# Patient Record
Sex: Male | Born: 1975 | Race: White | Hispanic: No | Smoking: Current every day smoker
Health system: Southern US, Community
[De-identification: ages and names within clinical notes are randomized; demographics above are authoritative.]

## PROBLEM LIST (undated history)

## (undated) DIAGNOSIS — F909 Attention-deficit hyperactivity disorder, unspecified type: Secondary | ICD-10-CM

## (undated) DIAGNOSIS — F419 Anxiety disorder, unspecified: Secondary | ICD-10-CM

## (undated) DIAGNOSIS — M549 Dorsalgia, unspecified: Secondary | ICD-10-CM

---

## 2009-09-27 ENCOUNTER — Encounter
Admission: RE | Admit: 2009-09-27 | Discharge: 2009-10-31 | Payer: Self-pay | Admitting: Physical Medicine & Rehabilitation

## 2009-09-30 ENCOUNTER — Ambulatory Visit: Payer: Self-pay | Admitting: Physical Medicine & Rehabilitation

## 2009-10-11 ENCOUNTER — Ambulatory Visit: Payer: Self-pay | Admitting: Physical Medicine & Rehabilitation

## 2009-10-31 ENCOUNTER — Encounter
Admission: RE | Admit: 2009-10-31 | Discharge: 2009-11-21 | Payer: Self-pay | Admitting: Physical Medicine & Rehabilitation

## 2009-11-01 ENCOUNTER — Ambulatory Visit: Payer: Self-pay | Admitting: Physical Medicine & Rehabilitation

## 2009-12-04 ENCOUNTER — Encounter
Admission: RE | Admit: 2009-12-04 | Discharge: 2010-03-04 | Payer: Self-pay | Admitting: Physical Medicine & Rehabilitation

## 2009-12-05 ENCOUNTER — Ambulatory Visit: Payer: Self-pay | Admitting: Physical Medicine & Rehabilitation

## 2010-01-03 ENCOUNTER — Ambulatory Visit: Payer: Self-pay | Admitting: Physical Medicine & Rehabilitation

## 2010-01-22 ENCOUNTER — Ambulatory Visit: Payer: Self-pay | Admitting: Physical Medicine & Rehabilitation

## 2010-03-11 ENCOUNTER — Encounter
Admission: RE | Admit: 2010-03-11 | Discharge: 2010-06-09 | Payer: Self-pay | Admitting: Physical Medicine & Rehabilitation

## 2010-03-28 ENCOUNTER — Ambulatory Visit: Payer: Self-pay | Admitting: Physical Medicine & Rehabilitation

## 2010-04-23 ENCOUNTER — Ambulatory Visit: Payer: Self-pay | Admitting: Physical Medicine & Rehabilitation

## 2010-05-28 ENCOUNTER — Ambulatory Visit: Payer: Self-pay | Admitting: Physical Medicine & Rehabilitation

## 2010-06-06 ENCOUNTER — Encounter
Admission: RE | Admit: 2010-06-06 | Discharge: 2010-09-04 | Payer: Self-pay | Admitting: Physical Medicine & Rehabilitation

## 2010-07-17 ENCOUNTER — Ambulatory Visit: Payer: Self-pay | Admitting: Physical Medicine & Rehabilitation

## 2010-08-20 ENCOUNTER — Ambulatory Visit: Payer: Self-pay | Admitting: Physical Medicine & Rehabilitation

## 2010-09-10 ENCOUNTER — Encounter
Admission: RE | Admit: 2010-09-10 | Discharge: 2010-09-10 | Payer: Self-pay | Source: Home / Self Care | Attending: Physical Medicine & Rehabilitation | Admitting: Physical Medicine & Rehabilitation

## 2010-10-13 ENCOUNTER — Emergency Department (HOSPITAL_COMMUNITY)
Admission: EM | Admit: 2010-10-13 | Discharge: 2010-10-13 | Payer: Self-pay | Source: Home / Self Care | Admitting: Emergency Medicine

## 2011-02-10 LAB — DIFFERENTIAL
Eosinophils Absolute: 0.2 10*3/uL (ref 0.0–0.7)
Lymphs Abs: 2 10*3/uL (ref 0.7–4.0)
Monocytes Absolute: 0.3 10*3/uL (ref 0.1–1.0)
Monocytes Relative: 6 % (ref 3–12)
Neutrophils Relative %: 56 % (ref 43–77)

## 2011-02-10 LAB — BASIC METABOLIC PANEL
CO2: 23 mEq/L (ref 19–32)
Calcium: 9.3 mg/dL (ref 8.4–10.5)
Chloride: 106 mEq/L (ref 96–112)
GFR calc Af Amer: >60 mL/min (ref >60)
Potassium: 3.8 mEq/L (ref 3.5–5.1)
Sodium: 138 mEq/L (ref 135–145)

## 2011-02-10 LAB — RAPID URINE DRUG SCREEN, HOSP PERFORMED
Amphetamines: POSITIVE — AB
Cocaine: NOT DETECTED
Opiates: NOT DETECTED
Tetrahydrocannabinol: NOT DETECTED

## 2011-02-10 LAB — CBC
HCT: 44.1 % (ref 39.0–52.0)
Hemoglobin: 15.4 g/dL (ref 13.0–17.0)
MCH: 33 pg (ref 26.0–34.0)
RBC: 4.66 MIL/uL (ref 4.22–5.81)

## 2013-10-20 ENCOUNTER — Inpatient Hospital Stay (HOSPITAL_COMMUNITY)
Admission: EM | Admit: 2013-10-20 | Discharge: 2013-10-22 | DRG: 603 | Disposition: A | Discharge status: Home / Self Care | Payer: Self-pay | Attending: Internal Medicine | Admitting: Internal Medicine

## 2013-10-20 ENCOUNTER — Encounter (HOSPITAL_COMMUNITY): Payer: Self-pay | Admitting: Emergency Medicine

## 2013-10-20 ENCOUNTER — Emergency Department (HOSPITAL_COMMUNITY): Payer: Self-pay

## 2013-10-20 ENCOUNTER — Observation Stay (HOSPITAL_COMMUNITY): Payer: Self-pay

## 2013-10-20 DIAGNOSIS — F17200 Nicotine dependence, unspecified, uncomplicated: Secondary | ICD-10-CM | POA: Diagnosis present

## 2013-10-20 DIAGNOSIS — L03119 Cellulitis of unspecified part of limb: Secondary | ICD-10-CM | POA: Diagnosis present

## 2013-10-20 DIAGNOSIS — F411 Generalized anxiety disorder: Secondary | ICD-10-CM | POA: Diagnosis present

## 2013-10-20 DIAGNOSIS — F988 Other specified behavioral and emotional disorders with onset usually occurring in childhood and adolescence: Secondary | ICD-10-CM | POA: Diagnosis present

## 2013-10-20 DIAGNOSIS — L02419 Cutaneous abscess of limb, unspecified: Principal | ICD-10-CM | POA: Diagnosis present

## 2013-10-20 DIAGNOSIS — A4902 Methicillin resistant Staphylococcus aureus infection, unspecified site: Secondary | ICD-10-CM | POA: Diagnosis present

## 2013-10-20 HISTORY — DX: Dorsalgia, unspecified: M54.9

## 2013-10-20 LAB — CREATININE, SERUM
Creatinine, Ser: 0.72 mg/dL (ref 0.50–1.35)
GFR calc Af Amer: >90 mL/min (ref >90)
GFR calc non Af Amer: >90 mL/min (ref >90)

## 2013-10-20 LAB — CBC WITH DIFFERENTIAL/PLATELET
Basophils Absolute: 0 10*3/uL (ref 0.0–0.1)
Basophils Relative: 0 % (ref 0–1)
Hemoglobin: 13.9 g/dL (ref 13.0–17.0)
MCHC: 34.6 g/dL (ref 30.0–36.0)
Neutro Abs: 4.3 10*3/uL (ref 1.7–7.7)
Neutrophils Relative %: 59 % (ref 43–77)
RDW: 12.8 % (ref 11.5–15.5)

## 2013-10-20 LAB — BASIC METABOLIC PANEL
Chloride: 99 mEq/L (ref 96–112)
GFR calc Af Amer: >90 mL/min (ref >90)
Potassium: 3.6 mEq/L (ref 3.5–5.1)

## 2013-10-20 LAB — CBC
HCT: 41.6 % (ref 39.0–52.0)
MCH: 31.3 pg (ref 26.0–34.0)
MCHC: 33.7 g/dL (ref 30.0–36.0)
Platelets: 241 10*3/uL (ref 150–400)
RBC: 4.48 MIL/uL (ref 4.22–5.81)
RDW: 12.8 % (ref 11.5–15.5)
WBC: 6.3 10*3/uL (ref 4.0–10.5)

## 2013-10-20 MED ORDER — SODIUM CHLORIDE 0.9 % IV BOLUS (SEPSIS)
1000.0000 mL | Freq: Once | INTRAVENOUS | Status: AC
  Administered 2013-10-20: 1000 mL via INTRAVENOUS

## 2013-10-20 MED ORDER — MUPIROCIN 2 % EX OINT
1.0000 "application " | TOPICAL_OINTMENT | Freq: Two times a day (BID) | CUTANEOUS | Status: DC
  Administered 2013-10-20 – 2013-10-22 (×5): 1 via NASAL
  Filled 2013-10-20: qty 22

## 2013-10-20 MED ORDER — MUPIROCIN 2 % EX OINT
1.0000 "application " | TOPICAL_OINTMENT | Freq: Two times a day (BID) | CUTANEOUS | Status: DC
  Filled 2013-10-20: qty 22

## 2013-10-20 MED ORDER — ALUM & MAG HYDROXIDE-SIMETH 200-200-20 MG/5ML PO SUSP: 30.0000 mL | Freq: Four times a day (QID) | ORAL | Status: DC | PRN

## 2013-10-20 MED ORDER — MUPIROCIN CALCIUM 2 % EX CREA
TOPICAL_CREAM | Freq: Two times a day (BID) | CUTANEOUS | Status: DC
  Filled 2013-10-20: qty 15

## 2013-10-20 MED ORDER — CHLORHEXIDINE GLUCONATE CLOTH 2 % EX PADS
6.0000 | MEDICATED_PAD | Freq: Every day | CUTANEOUS | Status: DC
  Administered 2013-10-21 – 2013-10-22 (×2): 6 via TOPICAL

## 2013-10-20 MED ORDER — PIPERACILLIN-TAZOBACTAM 3.375 G IVPB
3.3750 g | Freq: Three times a day (TID) | INTRAVENOUS | Status: DC
  Administered 2013-10-20 – 2013-10-22 (×7): 3.375 g via INTRAVENOUS
  Filled 2013-10-20 (×10): qty 50

## 2013-10-20 MED ORDER — POTASSIUM CHLORIDE IN NACL 20-0.9 MEQ/L-% IV SOLN
INTRAVENOUS | Status: AC
  Administered 2013-10-20: 11:00:00 via INTRAVENOUS
  Filled 2013-10-20 (×3): qty 1000

## 2013-10-20 MED ORDER — ONDANSETRON HCL 4 MG/2ML IJ SOLN: 4.0000 mg | Freq: Four times a day (QID) | INTRAMUSCULAR | Status: DC | PRN

## 2013-10-20 MED ORDER — HEPARIN SODIUM (PORCINE) 5000 UNIT/ML IJ SOLN
5000.0000 [IU] | Freq: Three times a day (TID) | INTRAMUSCULAR | Status: DC
  Administered 2013-10-20 – 2013-10-21 (×3): 5000 [IU] via SUBCUTANEOUS
  Filled 2013-10-20 (×6): qty 1

## 2013-10-20 MED ORDER — MORPHINE SULFATE 4 MG/ML IJ SOLN
2.0000 mg | INTRAMUSCULAR | Status: DC | PRN
Start: 2013-10-20 — End: 2013-10-22
  Administered 2013-10-20 – 2013-10-22 (×5): 2 mg via INTRAVENOUS
  Filled 2013-10-20 (×5): qty 1

## 2013-10-20 MED ORDER — ALPRAZOLAM 0.5 MG PO TABS
0.5000 mg | ORAL_TABLET | Freq: Three times a day (TID) | ORAL | Status: DC | PRN
  Administered 2013-10-20: 0.5 mg via ORAL
  Filled 2013-10-20 (×2): qty 1

## 2013-10-20 MED ORDER — AMPHETAMINE-DEXTROAMPHETAMINE 10 MG PO TABS
20.0000 mg | ORAL_TABLET | Freq: Every day | ORAL | Status: DC
  Administered 2013-10-20 – 2013-10-22 (×3): 20 mg via ORAL
  Filled 2013-10-20 (×3): qty 2

## 2013-10-20 MED ORDER — CHLORHEXIDINE GLUCONATE 4 % EX LIQD
1.0000 "application " | Freq: Every day | CUTANEOUS | Status: DC
  Filled 2013-10-20: qty 15

## 2013-10-20 MED ORDER — INFLUENZA VAC SPLIT QUAD 0.5 ML IM SUSP
0.5000 mL | INTRAMUSCULAR | Status: AC
  Administered 2013-10-21: 0.5 mL via INTRAMUSCULAR
  Filled 2013-10-20: qty 0.5

## 2013-10-20 MED ORDER — MORPHINE SULFATE 4 MG/ML IJ SOLN
4.0000 mg | Freq: Once | INTRAMUSCULAR | Status: AC
  Administered 2013-10-20: 4 mg via INTRAVENOUS
  Filled 2013-10-20: qty 1

## 2013-10-20 MED ORDER — HYDROCODONE-ACETAMINOPHEN 5-325 MG PO TABS
1.0000 | ORAL_TABLET | ORAL | Status: DC | PRN
  Administered 2013-10-20 – 2013-10-21 (×4): 2 via ORAL
  Administered 2013-10-21: 1 via ORAL
  Administered 2013-10-22 (×2): 2 via ORAL
  Filled 2013-10-20 (×3): qty 2
  Filled 2013-10-20: qty 1
  Filled 2013-10-20 (×4): qty 2

## 2013-10-20 MED ORDER — PIPERACILLIN-TAZOBACTAM 3.375 G IVPB 30 MIN
3.3750 g | Freq: Once | INTRAVENOUS | Status: AC
  Administered 2013-10-20: 3.375 g via INTRAVENOUS

## 2013-10-20 MED ORDER — POLYETHYLENE GLYCOL 3350 17 G PO PACK
17.0000 g | PACK | Freq: Every day | ORAL | Status: DC | PRN
  Filled 2013-10-20: qty 1

## 2013-10-20 MED ORDER — ONDANSETRON HCL 4 MG PO TABS: 4.0000 mg | ORAL_TABLET | Freq: Four times a day (QID) | ORAL | Status: DC | PRN

## 2013-10-20 MED ORDER — VANCOMYCIN HCL IN DEXTROSE 1-5 GM/200ML-% IV SOLN
1000.0000 mg | Freq: Once | INTRAVENOUS | Status: AC
  Administered 2013-10-20: 1000 mg via INTRAVENOUS
  Filled 2013-10-20: qty 200

## 2013-10-20 MED ORDER — ALBUTEROL SULFATE (5 MG/ML) 0.5% IN NEBU: 2.5000 mg | INHALATION_SOLUTION | RESPIRATORY_TRACT | Status: DC | PRN

## 2013-10-20 MED ORDER — GUAIFENESIN-DM 100-10 MG/5ML PO SYRP
5.0000 mL | ORAL_SOLUTION | ORAL | Status: DC | PRN
  Filled 2013-10-20: qty 5

## 2013-10-20 MED ORDER — VANCOMYCIN HCL IN DEXTROSE 1-5 GM/200ML-% IV SOLN
1000.0000 mg | Freq: Three times a day (TID) | INTRAVENOUS | Status: DC
  Administered 2013-10-20 – 2013-10-22 (×7): 1000 mg via INTRAVENOUS
  Filled 2013-10-20 (×10): qty 200

## 2013-10-20 NOTE — ED Notes (Signed)
Pt has been sleeping, wife at bedside.  VSS, no needs verbalized.

## 2013-10-20 NOTE — ED Notes (Signed)
Originally bit by a brown recluse spider in August.  Has been treated with Bactrim 2 times for the same.  This week his left thigh began to swell and turn bluish purple and is very painful

## 2013-10-20 NOTE — ED Notes (Signed)
Introduced self to patient.  C/o large reddened area to left thigh area.  Reports problems with infected areas on skin began in August when he was dx and treated for a brown recluse spider bite.  Since that time he has had many areas on his axilla, legs and into leg creases that has been red, hard and painful.  Most recent is the one he presents with tonight.  Majority of thigh reddened with one area centrally harder than the rest.

## 2013-10-20 NOTE — ED Notes (Signed)
Large reddened area on left thigh marked with pen.

## 2013-10-20 NOTE — H&P (Signed)
[facility]                                                                                    Patient Demographics  Jesus Rivera, is a 37 y.o. male  MRN: [id number]   DOB - [birth year]  Admit Date - 10/20/2013  Outpatient Primary MD for the patient is No PCP Per Patient   With History of -  Past Medical History  Diagnosis Date  . Back pain       History reviewed. No pertinent past surgical history.  in for   Chief Complaint  Patient presents with  . Leg Swelling     HPI  Jesus Rivera  is a 37 y.o. male, whose been having problems with pustules in both lower extremities over the last few months, he initially thought that he had a spider bite to his right leg where he had developed a pustule which healed after oral antibiotics, he subsequently developed other pustule in the right leg few weeks later, that healed by itself, he now developed a pustule in the left thigh which failed oral Bactrim treatment, he presented to the ER with left thigh pustule with evidence of a very small abscess and mild surrounding cellulitis. He was afebrile without any leukocytosis. Partner was called last night with Septra dementia admission.   Should himself denies any fever chills, denies any present or past use of IV drugs, denies any history of diabetes mellitus and himself.    Review of Systems    In addition to the HPI above,  No Fever-chills, No Headache, No changes with Vision or hearing, No problems swallowing food or Liquids, No Chest pain, Cough or Shortness of Breath, No Abdominal pain, No Nausea or Vommitting, Bowel movements are regular, No Blood in stool or Urine, No dysuria, No new skin rashes or bruises, except left eye as above No new joints pains-aches,  No new weakness, tingling, numbness in any extremity, No recent weight gain or loss, No polyuria, polydypsia or polyphagia, No significant Mental Stressors.  A full 10 point Review of Systems was done, except  as stated above, all other Review of Systems were negative.   Social History History  Substance Use Topics  . Smoking status: Current Every Day Smoker  . Smokeless tobacco: Never Used  . Alcohol Use: No      Family History No history of diabetes mellitus  Prior to Admission medications   Medication Sig Start Date End Date Taking? Authorizing Provider  ALPRAZolam Prudy Feeler) 1 MG tablet Take 1 mg by mouth 4 (four) times daily as needed for anxiety.   Yes Historical Provider, MD  amphetamine-dextroamphetamine (ADDERALL) 20 MG tablet Take 20 mg by mouth daily.   Yes Historical Provider, MD    No Known Allergies  Physical Exam  Vitals  Blood pressure 100/69, pulse 80, temperature 97.7 F (36.5 C), temperature source Oral, resp. rate 18, height 6\' 2"  (1.88 m), weight 102.059 kg (225 lb), SpO2 97.00%.   1. General middle-aged well-built Caucasian male lying in bed in NAD,     2. Normal affect and insight, Not Suicidal or Homicidal, Awake Alert, Oriented X 3.  3.  No F.N deficits, ALL C.Nerves Intact, Strength 5/5 all 4 extremities, Sensation intact all 4 extremities, Plantars down going.  4. Ears and Eyes appear Normal, Conjunctivae clear, PERRLA. Moist Oral Mucosa.  5. Supple Neck, No JVD, No cervical lymphadenopathy appriciated, No Carotid Bruits.  6. Symmetrical Chest wall movement, Good air movement bilaterally, CTAB.  7. RRR, No Gallops, Rubs or Murmurs, No Parasternal Heave.  8. Positive Bowel Sounds, Abdomen Soft, Non tender, No organomegaly appriciated,No rebound -guarding or rigidity.  9.  No Cyanosis, Normal Skin Turgor, No Skin Rash or Bruise. Right eye has a small pustule with some induration and fluctuance about 1 cm in diameter with mild cellulitis around it, there is other he'll pustule right above that.  10. Good muscle tone,  joints appear normal , no effusions, Normal ROM.  11. No Palpable Lymph Nodes in Neck or Axillae     Data Review  CBC  Recent  Labs Lab 10/20/13 0353 10/20/13 0752  WBC 7.2 6.3  HGB 13.9 14.0  HCT 40.2 41.6  PLT 217 241  MCV 94.1 92.9  MCH 32.6 31.3  MCHC 34.6 33.7  RDW 12.8 12.8  LYMPHSABS 2.0  --   MONOABS 0.6  --   EOSABS 0.4  --   BASOSABS 0.0  --    ------------------------------------------------------------------------------------------------------------------  Chemistries   Recent Labs Lab 10/20/13 0353 10/20/13 0752  NA 135  --   K 3.6  --   CL 99  --   CO2 25  --   GLUCOSE 152*  --   BUN 8  --   CREATININE 0.76 0.72  CALCIUM 9.0  --    ------------------------------------------------------------------------------------------------------------------ estimated creatinine clearance is 162.9 ml/min (by C-G formula based on Cr of 0.72). ------------------------------------------------------------------------------------------------------------------ No results found for this basename: TSH, T4TOTAL, FREET3, T3FREE, THYROIDAB,  in the last 72 hours   Coagulation profile No results found for this basename: INR, PROTIME,  in the last 168 hours ------------------------------------------------------------------------------------------------------------------- No results found for this basename: DDIMER,  in the last 72 hours -------------------------------------------------------------------------------------------------------------------  Cardiac Enzymes No results found for this basename: CK, CKMB, TROPONINI, MYOGLOBIN,  in the last 168 hours ------------------------------------------------------------------------------------------------------------------ No components found with this basename: POCBNP,    ---------------------------------------------------------------------------------------------------------------  Urinalysis No results found for this basename: colorurine, appearanceur, labspec, phurine, glucoseu, hgbur, bilirubinur, ketonesur, proteinur, urobilinogen, nitrite,  leukocytesur    ----------------------------------------------------------------------------------------------------------------  Imaging results:   Dg Femur Left  10/20/2013   CLINICAL DATA:  Leg swelling  EXAM: LEFT FEMUR - 2 VIEW  COMPARISON:  None.  FINDINGS: There is no evidence of fracture or other focal bone lesions. No foreign body or subcutaneous gas.  IMPRESSION: No osseous abnormality.   Electronically Signed   By: Tiburcio Pea M.D.   On: 10/20/2013 04:37   [place] Extrem Low Left Ltd  10/20/2013   CLINICAL DATA:  Left thighs cellulitis.  EXAM: LEFT LOWER EXTREMITY SOFT TISSUE ULTRASOUND LIMITED  TECHNIQUE: Ultrasound examination was performed including evaluation of the muscles, tendons, joint, and adjacent soft tissues.  COMPARISON:  Radiographs dated 10/20/2013  FINDINGS: Ultrasound examination demonstrates a small amount of fluid between the fat lobules in the subcutaneous fat of the area of cellulitis in the left upper thigh. There is no abscess. The edema does not extend into the muscles.  IMPRESSION: No evidence of abscess in the soft tissues of the left thigh. Slight edema in the subcutaneous fat.   Electronically Signed   By: Geanie Cooley M.D.   On: 10/20/2013 09:03  Assessment & Plan    1. Left thigh 1 cm abscess with mild cellulitis - he has had recurrent pustules for the last few months, denies any fever chills, denies any IV drug abuse now or in the past, blood cultures obtained in the ER, I&D done bedside in my presence with maybe 5 cc of pus drained, x-ray left thigh and soft tissue ultrasound postdrainage are stable. IV antibiotics for 24-48 hours then transitioned to oral and discharged home with outpatient ID followup. Will check A1c.     2. History of anxiety and ADD. Continue home medications   DVT Prophylaxis Heparin  AM Labs Ordered, also please review Full Orders  Family Communication: Admission, patients condition and plan of care including  tests being ordered have been discussed with the patient and girl friend who indicate understanding and agree with the plan and Code Status.  Code Status full  Likely DC to home  Condition fair  Time spent in minutes 35    Susa Raring K M.D on 10/20/2013 at 9:08 AM  Between 7am to 7pm - Pager - [phone]  After 7pm go to [email or web address] - password [email or web address]  And look for the night coverage person covering me after hours  [facility] Office  [phone]

## 2013-10-20 NOTE — ED Provider Notes (Signed)
CSN: [id number]     Arrival date & time 10/20/13  0228 History   First MD Initiated Contact with Patient 10/20/13 0256     Chief Complaint  Patient presents with  . Leg Swelling   (Consider location/radiation/quality/duration/timing/severity/associated sxs/prior Treatment) HPI Comments: Pt comes in with cc of left leg swelling. No medical hx. Hx of similar swelling in the RLE, thought to be cellultiis/spider bite and treated with antibiotics. The current episode started a week ago, with a spider bite type lesion, and slowly spread. Overtime, the redness has increased, the pain has increased. He went to urgent care, and was started on bactrim, but his sx have continued to get worse. No n/v/f/c/ivda.  The history is provided by the patient.    Past Medical History  Diagnosis Date  . Back pain    History reviewed. No pertinent past surgical history. History reviewed. No pertinent family history. History  Substance Use Topics  . Smoking status: Current Every Day Smoker  . Smokeless tobacco: Never Used  . Alcohol Use: No    Review of Systems  Constitutional: Negative for activity change and appetite change.  Respiratory: Negative for cough and shortness of breath.   Cardiovascular: Negative for chest pain.  Gastrointestinal: Negative for abdominal pain.  Genitourinary: Negative for dysuria.  Musculoskeletal: Positive for arthralgias.  Skin: Positive for rash.    Allergies  Review of patient's allergies indicates no known allergies.  Home Medications   Current Outpatient Rx  Name  Route  Sig  Dispense  Refill  . ALPRAZolam (XANAX) 1 MG tablet   Oral   Take 1 mg by mouth 4 (four) times daily as needed for anxiety.         Marland Kitchen amphetamine-dextroamphetamine (ADDERALL) 20 MG tablet   Oral   Take 20 mg by mouth daily.          BP 100/69  Pulse 80  Temp(Src) 97.7 F (36.5 C) (Oral)  Resp 18  Ht 6\' 2"  (1.88 m)  Wt 225 lb (102.059 kg)  BMI 28.88 kg/m2  SpO2  97% Physical Exam  Nursing note and vitals reviewed. Constitutional: He is oriented to person, place, and time. He appears well-developed.  HENT:  Head: Normocephalic and atraumatic.  Eyes: Conjunctivae and EOM are normal. Pupils are equal, round, and reactive to light.  Neck: Normal range of motion. Neck supple.  Cardiovascular: Normal rate and regular rhythm.   Pulmonary/Chest: Effort normal and breath sounds normal.  Abdominal: Soft. Bowel sounds are normal. He exhibits no distension. There is no tenderness. There is no rebound and no guarding.  Neurological: He is alert and oriented to person, place, and time.  Skin: Skin is warm. Rash noted.  Left thigh region - large erythematous lesion, with callor. Pt has 2 bumps in the anterior thigh, 1 with induration, and it has fluid pocket per [place].    ED Course  Procedures (including critical care time) Labs Review Labs Reviewed  BASIC METABOLIC PANEL - Abnormal; Notable for the following:    Glucose, Bld 152 (*)    All other components within normal limits  CULTURE, BLOOD (SINGLE)  CBC WITH DIFFERENTIAL  HEMOGLOBIN A1C  CBC  CREATININE, SERUM   Imaging Review Dg Femur Left  10/20/2013   CLINICAL DATA:  Leg swelling  EXAM: LEFT FEMUR - 2 VIEW  COMPARISON:  None.  FINDINGS: There is no evidence of fracture or other focal bone lesions. No foreign body or subcutaneous gas.  IMPRESSION: No osseous abnormality.  Electronically Signed   By: Tiburcio Pea M.D.   On: 10/20/2013 04:37    EKG Interpretation   None       MDM   1. Cellulitis and abscess of leg   2. ADD (attention deficit disorder)     ULTRASOUND LIMITED SOFT TISSUE/ MUSCULOSKELETAL: Thigh, left Indication: abscess evaluation Linear probe used to evaluate area of interest in two planes. Findings:  Pt has a pocket of abscess at the site of induration Performed by: Dr Rhunette Croft Images saved electronically  Pt has cellulitis leg, with a site of abscess, that was  drained. Pt is essentially failed outpatient therapy, and so we will start ivab and admit for obs. Xray shows no free air. No significant risk factors for deep infection.   INCISION AND DRAINAGE Performed by: Derwood Kaplan Consent: Verbal consent obtained. Risks and benefits: risks, benefits and alternatives were discussed Type: abscess  Body area: Left thigh anterior  Anesthesia: local infiltration  Incision was made with a scalpel.  Local anesthetic: lidocaine 1% with epinephrine  Anesthetic total: 4 ml  Complexity: complex Blunt dissection to break up loculations  Drainage: purulent  Drainage amount: 5 cc  Packing material: 1/4 in iodoform gauze  Patient tolerance: Patient tolerated the procedure well with no immediate complications.      Derwood Kaplan, MD 10/20/13 [phone]

## 2013-10-20 NOTE — ED Notes (Signed)
MD at bedside. 

## 2013-10-20 NOTE — Progress Notes (Signed)
ANTIBIOTIC CONSULT NOTE - INITIAL  Pharmacy Consult for Vancocin and Zosyn Indication: LLE cellulitis/abscess  No Known Allergies  Patient Measurements: Height: 6\' 2"  (188 cm) Weight: 225 lb (102.059 kg) IBW/kg (Calculated) : 82.2  Vital Signs: Temp: 97.7 F (36.5 C) (11/21 0240) Temp src: Oral (11/21 0240) BP: 100/69 mmHg (11/21 0700) Pulse Rate: 80 (11/21 0700)  Labs:  Recent Labs  10/20/13 0353  WBC 7.2  HGB 13.9  PLT 217  CREATININE 0.76   Estimated Creatinine Clearance: 162.9 ml/min (by C-G formula based on Cr of 0.76).   Microbiology: No results found for this or any previous visit (from the past 720 hour(s)).  Medical History: Past Medical History  Diagnosis Date  . Back pain     Assessment: 37yo male c/o large reddened area to left thigh area, reports having infected spider bite with multiple skin problems since, to begin IV ABX.  Goal of Therapy:  Vancomycin trough 10-20  Plan:  Will begin vancomycin 1000mg  IV Q8H and Zosyn 3.375g IV Q8H and monitor CBC, Cx, levels prn.  Vernard Gambles, PharmD, BCPS  10/20/2013,7:40 AM

## 2013-10-21 DIAGNOSIS — L02419 Cutaneous abscess of limb, unspecified: Principal | ICD-10-CM

## 2013-10-21 DIAGNOSIS — F988 Other specified behavioral and emotional disorders with onset usually occurring in childhood and adolescence: Secondary | ICD-10-CM

## 2013-10-21 LAB — CBC
HCT: 40.4 % (ref 39.0–52.0)
MCHC: 33.4 g/dL (ref 30.0–36.0)
MCV: 95.1 fL (ref 78.0–100.0)
RDW: 12.9 % (ref 11.5–15.5)
WBC: 5.6 10*3/uL (ref 4.0–10.5)

## 2013-10-21 LAB — BASIC METABOLIC PANEL
BUN: 8 mg/dL (ref 6–23)
CO2: 26 mEq/L (ref 19–32)
Chloride: 103 mEq/L (ref 96–112)
Creatinine, Ser: 0.8 mg/dL (ref 0.50–1.35)
Glucose, Bld: 93 mg/dL (ref 70–99)
Potassium: 4.2 mEq/L (ref 3.5–5.1)
Sodium: 138 mEq/L (ref 135–145)

## 2013-10-21 NOTE — Progress Notes (Signed)
ANTICOAGULATION CONSULT NOTE - Initial Consult  Pharmacy Consult for lovenox Indication: VTE prophylaxis  No Known Allergies  Patient Measurements: Height: 6\' 2"  (188 cm) Weight: 244 lb 8 oz (110.904 kg) IBW/kg (Calculated) : 82.2 Heparin Dosing Weight:   Vital Signs: Temp: 97.8 F (36.6 C) (11/22 0457) Temp src: Oral (11/22 0457) BP: 106/57 mmHg (11/22 0457) Pulse Rate: 81 (11/22 0457)  Labs:  Recent Labs  10/20/13 0353 10/20/13 0752 10/21/13 0503  HGB 13.9 14.0 13.5  HCT 40.2 41.6 40.4  PLT 217 241 240  CREATININE 0.76 0.72 0.80    Estimated Creatinine Clearance: 169.2 ml/min (by C-G formula based on Cr of 0.8).   Medical History: Past Medical History  Diagnosis Date  . Back pain     Medications:  Scheduled:  . amphetamine-dextroamphetamine  20 mg Oral Daily  . Chlorhexidine Gluconate Cloth  6 each Topical Q0600  . influenza vac split quadrivalent PF  0.5 mL Intramuscular Tomorrow-1000  . mupirocin ointment  1 application Nasal BID  . piperacillin-tazobactam (ZOSYN)  IV  3.375 g Intravenous Q8H  . vancomycin  1,000 mg Intravenous Q8H   Infusions:  . 0.9 % NaCl with KCl 20 mEq / L 75 mL/hr at 10/20/13 1032    Assessment: 37 yo male will be switched from heparin to lovenox for VTE prophylaxis.  Patient's weight is 110.9 kg and CrCl > 100.  Last heparin SQ dose was at 0524 on 10/21/13 Goal of Therapy:  Anti-Xa level 0.3-0.6 Monitor platelets by anticoagulation protocol: Yes   Plan:  1) Lovenox 55 mg sq q24h.  Pharmacy will sign off  Vielka Klinedinst, Tsz-Yin 10/21/2013,10:00 AM

## 2013-10-21 NOTE — Progress Notes (Signed)
[facility] PROGRESS NOTE  Jesus Rivera ZOX:[id number] DOB: 1976/04/05 DOA: 10/20/2013 PCP: No PCP Per Patient  Assessment/Plan  Left thigh abscess with cellulitis, increased redness and swelling since yesterday.  No abscess demonstrated on [place] yesterday, but may be developing.  No fluctuance but tense induration still today. -  Change packing -  Continue IV antibiotics -  Consider surgery consult if not improving or if developing fluctuance -  Continue vicodin for pain control -  A1c wnl -  Wound culture:  Moderate Staph.  Sensitivities pending -  Blood culture pending -  Nasal culture pending  ADD, stable.  Continue adderall Anxiety, stable.  Continue prn xanax  Diet:  regular Access:  PIV IVF:  KVO Proph:  lovenox  Code Status: full Family Communication: patient and his wife Disposition Plan: pending improvement in leg abscess   Consultants:  none  Procedures:  I&D 11/21  Antibiotics:  Vancomycin and zosyn 11/21 >>   HPI/Subjective:  States his leg continues to hurt.  It is more red and swollen today than yesterday prior to I&D despite antibiotics.  Has been draining small amount of blood.      Objective: Filed Vitals:   10/20/13 1425 10/20/13 1638 10/20/13 2148 10/21/13 0457  BP: 117/70 117/72 103/53 106/57  Pulse: 84 86 74 81  Temp: 97.6 F (36.4 C) 97.9 F (36.6 C) 97.4 F (36.3 C) 97.8 F (36.6 C)  TempSrc:   Oral Oral  Resp: 18 18 18 18   Height:   6\' 2"  (1.88 m)   Weight:   110.904 kg (244 lb 8 oz)   SpO2: 100% 100% 100% 95%    Intake/Output Summary (Last 24 hours) at 10/21/13 0845 Last data filed at 10/20/13 1856  Gross per 24 hour  Intake    940 ml  Output      0 ml  Net    940 ml   Filed Weights   10/20/13 0240 10/20/13 0923 10/20/13 2148  Weight: 102.059 kg (225 lb) 109.8 kg (242 lb 1 oz) 110.904 kg (244 lb 8 oz)    Exam:   General:  CM, No acute distress  HEENT:  NCAT, MMM  Cardiovascular:  RRR, nl S1, S2 no mrg,  2+ pulses, warm extremities  Respiratory:  CTAB, no increased WOB  Abdomen:   NABS, soft, NT/ND  MSK:   Normal tone and bulk.  Left thigh with packing with small amount of red blood, tensely indurated, warm, and very tender.  Induration and erythema appear worse than yesterday.  Induration spreads posteriorly and superiorly up leg.    Neuro:  Grossly intact  Data Reviewed: Basic Metabolic Panel:  Recent Labs Lab 10/20/13 0353 10/20/13 0752 10/21/13 0503  NA 135  --  138  K 3.6  --  4.2  CL 99  --  103  CO2 25  --  26  GLUCOSE 152*  --  93  BUN 8  --  8  CREATININE 0.76 0.72 0.80  CALCIUM 9.0  --  8.8   Liver Function Tests: No results found for this basename: AST, ALT, ALKPHOS, BILITOT, PROT, ALBUMIN,  in the last 168 hours No results found for this basename: LIPASE, AMYLASE,  in the last 168 hours No results found for this basename: AMMONIA,  in the last 168 hours CBC:  Recent Labs Lab 10/20/13 0353 10/20/13 0752 10/21/13 0503  WBC 7.2 6.3 5.6  NEUTROABS 4.3  --   --   HGB 13.9 14.0 13.5  HCT 40.2 41.6 40.4  MCV 94.1 92.9 95.1  PLT 217 241 240   Cardiac Enzymes: No results found for this basename: CKTOTAL, CKMB, CKMBINDEX, TROPONINI,  in the last 168 hours BNP (last 3 results) No results found for this basename: PROBNP,  in the last 8760 hours CBG: No results found for this basename: GLUCAP,  in the last 168 hours  Recent Results (from the past 240 hour(s))  WOUND CULTURE     Status: None   Collection Time    10/20/13  7:27 AM      Result Value Range Status   Specimen Description WOUND LEFT LEG   Final   Special Requests Normal   Final   Gram Stain     Final   Value: NO WBC SEEN     NO SQUAMOUS EPITHELIAL CELLS SEEN     NO ORGANISMS SEEN     Performed at [facility]   Culture     Final   Value: MODERATE STAPHYLOCOCCUS AUREUS     Note: RIFAMPIN AND GENTAMICIN SHOULD NOT BE USED AS SINGLE DRUGS FOR TREATMENT OF STAPH INFECTIONS.     Performed  at [facility]   Report Status PENDING   Incomplete     Studies: Dg Femur Left  10/20/2013   CLINICAL DATA:  Leg swelling  EXAM: LEFT FEMUR - 2 VIEW  COMPARISON:  None.  FINDINGS: There is no evidence of fracture or other focal bone lesions. No foreign body or subcutaneous gas.  IMPRESSION: No osseous abnormality.   Electronically Signed   By: Tiburcio Pea M.D.   On: 10/20/2013 04:37   [place] Extrem Low Left Ltd  10/20/2013   CLINICAL DATA:  Left thighs cellulitis.  EXAM: LEFT LOWER EXTREMITY SOFT TISSUE ULTRASOUND LIMITED  TECHNIQUE: Ultrasound examination was performed including evaluation of the muscles, tendons, joint, and adjacent soft tissues.  COMPARISON:  Radiographs dated 10/20/2013  FINDINGS: Ultrasound examination demonstrates a small amount of fluid between the fat lobules in the subcutaneous fat of the area of cellulitis in the left upper thigh. There is no abscess. The edema does not extend into the muscles.  IMPRESSION: No evidence of abscess in the soft tissues of the left thigh. Slight edema in the subcutaneous fat.   Electronically Signed   By: Geanie Cooley M.D.   On: 10/20/2013 09:03    Scheduled Meds: . amphetamine-dextroamphetamine  20 mg Oral Daily  . Chlorhexidine Gluconate Cloth  6 each Topical Q0600  . heparin  5,000 Units Subcutaneous Q8H  . influenza vac split quadrivalent PF  0.5 mL Intramuscular Tomorrow-1000  . mupirocin ointment  1 application Nasal BID  . piperacillin-tazobactam (ZOSYN)  IV  3.375 g Intravenous Q8H  . vancomycin  1,000 mg Intravenous Q8H   Continuous Infusions: . 0.9 % NaCl with KCl 20 mEq / L 75 mL/hr at 10/20/13 1032    Principal Problem:   Cellulitis and abscess of leg Active Problems:   ADD (attention deficit disorder)    Time spent: 30 min    Paytan Recine, [facility]  [facility] Pager [phone]. If 7PM-7AM, please contact night-coverage at [email or web address], password [email or web address] 10/21/2013, 8:45 AM  LOS: 1 day

## 2013-10-22 LAB — WOUND CULTURE

## 2013-10-22 LAB — BASIC METABOLIC PANEL
BUN: 8 mg/dL (ref 6–23)
CO2: 27 mEq/L (ref 19–32)
Chloride: 104 mEq/L (ref 96–112)
Creatinine, Ser: 0.66 mg/dL (ref 0.50–1.35)
GFR calc Af Amer: >90 mL/min (ref >90)
GFR calc non Af Amer: >90 mL/min (ref >90)

## 2013-10-22 LAB — CBC
HCT: 41.9 % (ref 39.0–52.0)
MCHC: 33.9 g/dL (ref 30.0–36.0)
MCV: 93.7 fL (ref 78.0–100.0)
RDW: 12.7 % (ref 11.5–15.5)

## 2013-10-22 MED ORDER — CHLORHEXIDINE GLUCONATE CLOTH 2 % EX PADS: 6.0000 | MEDICATED_PAD | Freq: Every day | CUTANEOUS | Status: DC

## 2013-10-22 MED ORDER — HYDROCODONE-ACETAMINOPHEN 5-325 MG PO TABS: 1.0000 | ORAL_TABLET | ORAL | Status: DC | PRN

## 2013-10-22 MED ORDER — MUPIROCIN 2 % EX OINT: 1.0000 "application " | TOPICAL_OINTMENT | Freq: Two times a day (BID) | CUTANEOUS | Status: DC

## 2013-10-22 MED ORDER — DOXYCYCLINE HYCLATE 100 MG PO CAPS: 100.0000 mg | ORAL_CAPSULE | Freq: Two times a day (BID) | ORAL | Status: DC

## 2013-10-22 NOTE — Discharge Summary (Addendum)
Physician Discharge Summary  Jesus Rivera:[id number] DOB: [birth year] DOA: 10/20/2013  PCP: No PCP Per Patient  Admit date: 10/20/2013 Discharge date: 10/22/2013  Recommendations for Outpatient Follow-up:  1. Follow up with primary care doctor or infectious disease in 1-2 days for repacking and examination 2. Follow up on final report from wound culture, including sensitivities, and blood culture, and nasal culture which are pending at the time of discharge  Discharge Diagnoses:  Principal Problem:   Cellulitis and abscess of leg Active Problems:   ADD (attention deficit disorder)   Discharge Condition: stable, improved  Diet recommendation: regular  Wt Readings from Last 3 Encounters:  10/21/13 102.468 kg (225 lb 14.4 oz)    History of present illness:  Jesus Rivera is a 37 y.o. male, whose been having problems with pustules in both lower extremities over the last few months, he initially thought that he had a spider bite to his right leg where he had developed a pustule which healed after oral antibiotics, he subsequently developed other pustule in the right leg few weeks later, that healed by itself, he now developed a pustule in the left thigh which failed oral Bactrim treatment, he presented to the ER with left thigh pustule with evidence of a very small abscess and mild surrounding cellulitis. He was afebrile without any leukocytosis. Partner was called last night with Septra dementia admission.  Should himself denies any fever chills, denies any present or past use of IV drugs, denies any history of diabetes mellitus and himself.    Hospital Course:   Left thigh abscess with cellulitis.  Mr. Apfel was started on IV vancomycin and zosyn after initial incision and drainage.  He was given warm compresses.  Initially, the erythema and induration increased, however, by the second day of admission, they had gone down considerable.  He continued iodoform packing changes once  daily.  His wound culture grew MRSA sensitive to tetracyclines.  He should continue chlorhexidine wipes, bactroban nasal ointment, and doxycycline at discharge.  A1c was wnl.    ADD, stable. Continued adderall  Anxiety, stable. Continued prn xanax   Consultants:  none Procedures:  I&D 11/21 Antibiotics:  Vancomycin and zosyn 11/21 >> 11/23, then doxycycline   Discharge Exam: Filed Vitals:   10/22/13 1343  BP: 113/80  Pulse: 82  Temp: 98 F (36.7 C)  Resp:    Filed Vitals:   10/21/13 1718 10/21/13 2100 10/22/13 0918 10/22/13 1343  BP: 124/86 118/80 120/89 113/80  Pulse: 78 85 79 82  Temp: 97.6 F (36.4 C) 97.4 F (36.3 C) 97.1 F (36.2 C) 98 F (36.7 C)  TempSrc: Oral Oral Oral Oral  Resp: 18 18 18    Height:      Weight:  102.468 kg (225 lb 14.4 oz)    SpO2: 99% 99% 100% 100%    General: CM, No acute distress  HEENT: NCAT, MMM  Cardiovascular: RRR, nl S1, S2 no mrg, 2+ pulses, warm extremities  Respiratory: CTAB, no increased WOB  Abdomen: NABS, soft, NT/ND  MSK: Normal tone and bulk. Left thigh considerably less indurated and erythematous today.  Small amount of drainage on iodoform.    Neuro: Grossly intact  Discharge Instructions      Discharge Orders   Future Orders Complete By Expires   Call MD for:  difficulty breathing, headache or visual disturbances  As directed    Call MD for:  extreme fatigue  As directed    Call MD for:  hives  As directed    Call MD for:  persistant dizziness or light-headedness  As directed    Call MD for:  persistant nausea and vomiting  As directed    Call MD for:  redness, tenderness, or signs of infection (pain, swelling, redness, odor or green/yellow discharge around incision site)  As directed    Call MD for:  severe uncontrolled pain  As directed    Call MD for:  temperature >100.4  As directed    Diet general  As directed    Discharge instructions  As directed    Comments:     You were hospitalized with abscess of  the left leg.  The wound culture is growing MRSA sensitive to doxycycline.  Please take doxycyline twice daily and keep warm compresses on the swollen areas to allow then to drain.  Please see you primary care doctor or infectious disease doctor in 1-2 days to have the packing removed and the wound reexamined.  If you notice increased redness or swelling or pain, please seek immediate medical assistance.  Please use bactroban nasal ointment for 5 days and the chlorhexidine wipes until they are gone.   Increase activity slowly  As directed        Medication List         ALPRAZolam 1 MG tablet  Commonly known as:  XANAX  Take 1 mg by mouth 4 (four) times daily as needed for anxiety.     amphetamine-dextroamphetamine 20 MG tablet  Commonly known as:  ADDERALL  Take 20 mg by mouth daily.     Chlorhexidine Gluconate Cloth 2 % Pads  Apply 6 each topically daily at 6 (six) AM.     doxycycline 100 MG capsule  Commonly known as:  VIBRAMYCIN  Take 1 capsule (100 mg total) by mouth 2 (two) times daily.     HYDROcodone-acetaminophen 5-325 MG per tablet  Commonly known as:  NORCO/VICODIN  Take 1-2 tablets by mouth every 4 (four) hours as needed for moderate pain.     mupirocin ointment 2 %  Commonly known as:  BACTROBAN  Place 1 application into the nose 2 (two) times daily.       Follow-up Information   Follow up with PCP In 1 day. (bring paperwork with you)       Follow up with Johny Sax, MD. Schedule an appointment as soon as possible for a visit in 1 day.   Specialty:  Infectious Diseases   Contact information:   [address]  [address] [place] [address] [phone]        The results of significant diagnostics from this hospitalization (including imaging, microbiology, ancillary and laboratory) are listed below for reference.    Significant Diagnostic Studies: Dg Femur Left  10/20/2013   CLINICAL DATA:  Leg swelling  EXAM: LEFT FEMUR  - 2 VIEW  COMPARISON:  None.  FINDINGS: There is no evidence of fracture or other focal bone lesions. No foreign body or subcutaneous gas.  IMPRESSION: No osseous abnormality.   Electronically Signed   By: Tiburcio Pea M.D.   On: 10/20/2013 04:37   [place] Extrem Low Left Ltd  10/20/2013   CLINICAL DATA:  Left thighs cellulitis.  EXAM: LEFT LOWER EXTREMITY SOFT TISSUE ULTRASOUND LIMITED  TECHNIQUE: Ultrasound examination was performed including evaluation of the muscles, tendons, joint, and adjacent soft tissues.  COMPARISON:  Radiographs dated 10/20/2013  FINDINGS: Ultrasound examination demonstrates a small amount of fluid between  the fat lobules in the subcutaneous fat of the area of cellulitis in the left upper thigh. There is no abscess. The edema does not extend into the muscles.  IMPRESSION: No evidence of abscess in the soft tissues of the left thigh. Slight edema in the subcutaneous fat.   Electronically Signed   By: Geanie Cooley M.D.   On: 10/20/2013 09:03    Microbiology: Recent Results (from the past 240 hour(s))  WOUND CULTURE     Status: None   Collection Time    10/20/13  7:27 AM      Result Value Range Status   Specimen Description WOUND LEFT LEG   Final   Special Requests Normal   Final   Gram Stain     Final   Value: NO WBC SEEN     NO SQUAMOUS EPITHELIAL CELLS SEEN     NO ORGANISMS SEEN     Performed at [facility]   Culture     Final   Value: MODERATE METHICILLIN RESISTANT STAPHYLOCOCCUS AUREUS     Note: RIFAMPIN AND GENTAMICIN SHOULD NOT BE USED AS SINGLE DRUGS FOR TREATMENT OF STAPH INFECTIONS. This organism DOES NOT demonstrate inducible Clindamycin resistance in vitro. CRITICAL RESULT CALLED TO, READ BACK BY AND VERIFIED WITH: ALBERT RIO 11/23      @ 1015 BY REAMM     Performed at [facility]   Report Status 10/22/2013 FINAL   Final   Organism ID, Bacteria METHICILLIN RESISTANT STAPHYLOCOCCUS AUREUS   Final  CULTURE, BLOOD (SINGLE)     Status: None    Collection Time    10/20/13  7:50 AM      Result Value Range Status   Specimen Description BLOOD RIGHT ANTECUBITAL   Final   Special Requests BOTTLES DRAWN AEROBIC ONLY   Final   Culture  Setup Time     Final   Value: 10/20/2013 12:59     Performed at [facility]   Culture     Final   Value:        BLOOD CULTURE RECEIVED NO GROWTH TO DATE CULTURE WILL BE HELD FOR 5 DAYS BEFORE ISSUING A FINAL NEGATIVE REPORT     Performed at [facility]   Report Status PENDING   Incomplete  NASAL CULTURE (N/P)     Status: None   Collection Time    10/20/13  3:18 PM      Result Value Range Status   Specimen Description NASOPHARYNGEAL   Final   Special Requests NONE   Final   Culture     Final   Value: NORMAL NASOPHARYNGEAL FLORA     Performed at [facility]   Report Status PENDING   Incomplete     Labs: Basic Metabolic Panel:  Recent Labs Lab 10/20/13 0353 10/20/13 0752 10/21/13 0503 10/22/13 0604  NA 135  --  138 140  K 3.6  --  4.2 4.2  CL 99  --  103 104  CO2 25  --  26 27  GLUCOSE 152*  --  93 95  BUN 8  --  8 8  CREATININE 0.76 0.72 0.80 0.66  CALCIUM 9.0  --  8.8 8.9   Liver Function Tests: No results found for this basename: AST, ALT, ALKPHOS, BILITOT, PROT, ALBUMIN,  in the last 168 hours No results found for this basename: LIPASE, AMYLASE,  in the last 168 hours No results found for this basename: AMMONIA,  in the  last 168 hours CBC:  Recent Labs Lab 10/20/13 0353 10/20/13 0752 10/21/13 0503 10/22/13 0604  WBC 7.2 6.3 5.6 5.0  NEUTROABS 4.3  --   --   --   HGB 13.9 14.0 13.5 14.2  HCT 40.2 41.6 40.4 41.9  MCV 94.1 92.9 95.1 93.7  PLT 217 241 240 260   Cardiac Enzymes: No results found for this basename: CKTOTAL, CKMB, CKMBINDEX, TROPONINI,  in the last 168 hours BNP: BNP (last 3 results) No results found for this basename: PROBNP,  in the last 8760 hours CBG: No results found for this basename: GLUCAP,  in the last 168  hours  Time coordinating discharge: 45 minutes  Signed:  Renae Fickle  [facility] 10/22/2013, 4:09 PM

## 2013-10-23 LAB — NASAL CULTURE (N/P): Culture: NORMAL

## 2013-10-26 LAB — CULTURE, BLOOD (SINGLE)

## 2014-02-10 ENCOUNTER — Encounter (HOSPITAL_COMMUNITY): Payer: Self-pay | Admitting: Emergency Medicine

## 2014-02-10 ENCOUNTER — Inpatient Hospital Stay (HOSPITAL_COMMUNITY)
Admission: EM | Admit: 2014-02-10 | Discharge: 2014-02-12 | DRG: 603 | Disposition: A | Discharge status: Home / Self Care | Payer: Self-pay | Attending: Internal Medicine | Admitting: Internal Medicine

## 2014-02-10 ENCOUNTER — Emergency Department (HOSPITAL_COMMUNITY): Payer: Self-pay

## 2014-02-10 DIAGNOSIS — L02419 Cutaneous abscess of limb, unspecified: Principal | ICD-10-CM

## 2014-02-10 DIAGNOSIS — R7989 Other specified abnormal findings of blood chemistry: Secondary | ICD-10-CM | POA: Diagnosis present

## 2014-02-10 DIAGNOSIS — F988 Other specified behavioral and emotional disorders with onset usually occurring in childhood and adolescence: Secondary | ICD-10-CM

## 2014-02-10 DIAGNOSIS — L03119 Cellulitis of unspecified part of limb: Secondary | ICD-10-CM | POA: Diagnosis present

## 2014-02-10 DIAGNOSIS — R945 Abnormal results of liver function studies: Secondary | ICD-10-CM

## 2014-02-10 DIAGNOSIS — R748 Abnormal levels of other serum enzymes: Secondary | ICD-10-CM | POA: Diagnosis present

## 2014-02-10 LAB — CBC WITH DIFFERENTIAL/PLATELET
Basophils Absolute: 0 10*3/uL (ref 0.0–0.1)
Basophils Relative: 1 % (ref 0–1)
EOS ABS: 0.4 10*3/uL (ref 0.0–0.7)
Eosinophils Relative: 5 % (ref 0–5)
HCT: 41.5 % (ref 39.0–52.0)
HEMOGLOBIN: 14.3 g/dL (ref 13.0–17.0)
LYMPHS ABS: 2.8 10*3/uL (ref 0.7–4.0)
Lymphocytes Relative: 36 % (ref 12–46)
MCH: 32.5 pg (ref 26.0–34.0)
MCHC: 34.5 g/dL (ref 30.0–36.0)
MCV: 94.3 fL (ref 78.0–100.0)
MONOS PCT: 10 % (ref 3–12)
Monocytes Absolute: 0.8 10*3/uL (ref 0.1–1.0)
Neutro Abs: 3.9 10*3/uL (ref 1.7–7.7)
Neutrophils Relative %: 49 % (ref 43–77)
Platelets: 227 10*3/uL (ref 150–400)
RBC: 4.4 MIL/uL (ref 4.22–5.81)
RDW: 12.9 % (ref 11.5–15.5)
WBC: 8 10*3/uL (ref 4.0–10.5)

## 2014-02-10 LAB — BASIC METABOLIC PANEL
BUN: 9 mg/dL (ref 6–23)
CHLORIDE: 100 meq/L (ref 96–112)
CO2: 28 mEq/L (ref 19–32)
Calcium: 9.2 mg/dL (ref 8.4–10.5)
Creatinine, Ser: 0.75 mg/dL (ref 0.50–1.35)
GFR calc Af Amer: >90 mL/min (ref >90)
GFR calc non Af Amer: >90 mL/min (ref >90)
Glucose, Bld: 81 mg/dL (ref 70–99)
Potassium: 3.8 mEq/L (ref 3.7–5.3)
Sodium: 139 mEq/L (ref 137–147)

## 2014-02-10 MED ORDER — SODIUM CHLORIDE 0.9 % IJ SOLN
3.0000 mL | Freq: Two times a day (BID) | INTRAMUSCULAR | Status: DC
  Administered 2014-02-10 – 2014-02-11 (×2): 3 mL via INTRAVENOUS

## 2014-02-10 MED ORDER — DOCUSATE SODIUM 100 MG PO CAPS
100.0000 mg | ORAL_CAPSULE | Freq: Two times a day (BID) | ORAL | Status: DC
  Administered 2014-02-10: 100 mg via ORAL
  Filled 2014-02-10 (×5): qty 1

## 2014-02-10 MED ORDER — SODIUM CHLORIDE 0.9 % IV SOLN: 250.0000 mL | INTRAVENOUS | Status: DC | PRN

## 2014-02-10 MED ORDER — HYDROCODONE-ACETAMINOPHEN 5-325 MG PO TABS
1.0000 | ORAL_TABLET | ORAL | Status: DC | PRN
  Administered 2014-02-10 – 2014-02-12 (×7): 2 via ORAL
  Filled 2014-02-10 (×7): qty 2

## 2014-02-10 MED ORDER — VANCOMYCIN HCL IN DEXTROSE 1-5 GM/200ML-% IV SOLN
1000.0000 mg | Freq: Once | INTRAVENOUS | Status: AC
  Administered 2014-02-10: 1000 mg via INTRAVENOUS
  Filled 2014-02-10: qty 200

## 2014-02-10 MED ORDER — VANCOMYCIN HCL IN DEXTROSE 1-5 GM/200ML-% IV SOLN
1000.0000 mg | Freq: Three times a day (TID) | INTRAVENOUS | Status: DC
  Administered 2014-02-11 – 2014-02-12 (×4): 1000 mg via INTRAVENOUS
  Filled 2014-02-10 (×6): qty 200

## 2014-02-10 MED ORDER — ENOXAPARIN SODIUM 40 MG/0.4ML ~~LOC~~ SOLN
40.0000 mg | SUBCUTANEOUS | Status: DC
  Administered 2014-02-11: 40 mg via SUBCUTANEOUS
  Filled 2014-02-10 (×2): qty 0.4

## 2014-02-10 MED ORDER — SODIUM CHLORIDE 0.9 % IJ SOLN: 3.0000 mL | INTRAMUSCULAR | Status: DC | PRN

## 2014-02-10 MED ORDER — ACETAMINOPHEN 650 MG RE SUPP: 650.0000 mg | Freq: Four times a day (QID) | RECTAL | Status: DC | PRN

## 2014-02-10 MED ORDER — ACETAMINOPHEN 325 MG PO TABS: 650.0000 mg | ORAL_TABLET | Freq: Four times a day (QID) | ORAL | Status: DC | PRN

## 2014-02-10 MED ORDER — ONDANSETRON HCL 4 MG PO TABS: 4.0000 mg | ORAL_TABLET | Freq: Four times a day (QID) | ORAL | Status: DC | PRN

## 2014-02-10 MED ORDER — MORPHINE SULFATE 4 MG/ML IJ SOLN
4.0000 mg | Freq: Once | INTRAMUSCULAR | Status: AC
  Administered 2014-02-10: 4 mg via INTRAVENOUS
  Filled 2014-02-10: qty 1

## 2014-02-10 MED ORDER — ONDANSETRON HCL 4 MG/2ML IJ SOLN: 4.0000 mg | Freq: Four times a day (QID) | INTRAMUSCULAR | Status: DC | PRN

## 2014-02-10 MED ORDER — MORPHINE SULFATE 2 MG/ML IJ SOLN
2.0000 mg | INTRAMUSCULAR | Status: DC | PRN
  Administered 2014-02-11: 2 mg via INTRAVENOUS
  Filled 2014-02-10: qty 1

## 2014-02-10 MED ORDER — CLINDAMYCIN PHOSPHATE 900 MG/50ML IV SOLN
900.0000 mg | Freq: Once | INTRAVENOUS | Status: AC
  Administered 2014-02-10: 900 mg via INTRAVENOUS
  Filled 2014-02-10: qty 50

## 2014-02-10 MED ORDER — AMPHETAMINE-DEXTROAMPHETAMINE 10 MG PO TABS
20.0000 mg | ORAL_TABLET | Freq: Every day | ORAL | Status: DC
  Administered 2014-02-11 – 2014-02-12 (×2): 20 mg via ORAL
  Filled 2014-02-10 (×2): qty 2

## 2014-02-10 MED ORDER — ALPRAZOLAM 0.5 MG PO TABS
1.0000 mg | ORAL_TABLET | Freq: Four times a day (QID) | ORAL | Status: DC | PRN
  Administered 2014-02-10 – 2014-02-11 (×2): 1 mg via ORAL
  Filled 2014-02-10 (×2): qty 2

## 2014-02-10 NOTE — ED Notes (Signed)
Onset 1 week bump on left posterior calf.  Since yesterday redness has increased to tennis ball size and draining bloody discharge.  Painful to touch.

## 2014-02-10 NOTE — Progress Notes (Signed)
  Pt admitted to the unit. Pt is stable, alert and oriented per baseline. Oriented to room, staff, and call bell. Educated to call for any assistance. Bed in lowest position, call bell within reach- will continue to monitor. 

## 2014-02-10 NOTE — H&P (Signed)
PCP:  Konrad FelixHOMAS,BRAD, MD    Chief Complaint: leg pain  HPI: Jesus RobertsDarrell R Rivera is a 38 y.o. male   has a past medical history of Back pain.   Presented with  Since end of February patient have had left leg abscess that at first improved but for the past 2-3 days the redness and swelling started to spread down towards ankle. The abscess itself ruptured and was leaking purulent fluid. Patient had a history of abscess on the same leg in November was positive for MRSA.  Reports some subjective fevers and chills. Reports pain with weight bearing.  Patient reports taking adderal up 4 pills a day and then xanax at night.   Review of Systems:     Pertinent positives include: Fevers, chills, left leg swelling and drainage  Constitutional:  No weight loss, night sweats,  fatigue, weight loss  HEENT:  No headaches, Difficulty swallowing,Tooth/dental problems,Sore throat,  No sneezing, itching, ear ache, nasal congestion, post nasal drip,  Cardio-vascular:  No chest pain, Orthopnea, PND, anasarca, dizziness, palpitations.no Bilateral lower extremity swelling  GI:  No heartburn, indigestion, abdominal pain, nausea, vomiting, diarrhea, change in bowel habits, loss of appetite, melena, blood in stool, hematemesis Resp:  no shortness of breath at rest. No dyspnea on exertion, No excess mucus, no productive cough, No non-productive cough, No coughing up of blood.No change in color of mucus.No wheezing. Skin:  no rash or lesions. No jaundice GU:  no dysuria, change in color of urine, no urgency or frequency. No straining to urinate.  No flank pain.  Musculoskeletal:  No joint pain or no joint swelling. No decreased range of motion. No back pain.  Psych:  No change in mood or affect. No depression or anxiety. No memory loss.  Neuro: no localizing neurological complaints, no tingling, no weakness, no double vision, no gait abnormality, no slurred speech, no confusion  Otherwise ROS are negative except  for above, 10 systems were reviewed  Past Medical History: Past Medical History  Diagnosis Date  . Back pain    History reviewed. No pertinent past surgical history.   Medications: Prior to Admission medications   Medication Sig Start Date End Date Taking? Authorizing Provider  ALPRAZolam Prudy Feeler(XANAX) 1 MG tablet Take 1 mg by mouth 4 (four) times daily as needed for anxiety.   Yes Historical Provider, MD  amphetamine-dextroamphetamine (ADDERALL) 20 MG tablet Take 20 mg by mouth daily.    Historical Provider, MD    Allergies:  No Known Allergies  Social History:  Ambulatory   independently   Lives at home with family   reports that he has been smoking Cigarettes.  He has been smoking about 0.00 packs per day. He has never used smokeless tobacco. He reports that he does not drink alcohol or use illicit drugs.   Family History: family history includes Deep vein thrombosis in his father; Hypertension in his mother.    Physical Exam: Patient Vitals for the past 24 hrs:  BP Temp Temp src Pulse Resp SpO2  02/10/14 1946 109/76 mmHg 98.1 F (36.7 C) Oral 87 20 97 %  02/10/14 1754 123/84 mmHg 98.2 F (36.8 C) Oral 102 20 98 %    1. General:  in No Acute distress 2. Psychological: slightly lethargic but Oriented 3. Head/ENT:   Moist  Mucous Membranes                          Head Non traumatic, neck supple  Normal  Dentition 4. SKIN: normal   Skin turgor,  Skin clean Dry. Abcsess and drainage from the calf of left leg 5. Heart: Regular rate and rhythm no Murmur, Rub or gallop 6. Lungs: Clear to auscultation bilaterally, no wheezes or crackles   7. Abdomen: Soft, non-tender, Non distended 8. Lower extremities: no clubbing, cyanosis,   Edema on the left 9. Neurologically Grossly intact, moving all 4 extremities equally 10. MSK: Normal range of motion  body mass index is unknown because there is no weight on file.   Labs on Admission:   Recent Labs   02/10/14 1840  NA 139  K 3.8  CL 100  CO2 28  GLUCOSE 81  BUN 9  CREATININE 0.75  CALCIUM 9.2   No results found for this basename: AST, ALT, ALKPHOS, BILITOT, PROT, ALBUMIN,  in the last 72 hours No results found for this basename: LIPASE, AMYLASE,  in the last 72 hours  Recent Labs  02/10/14 1840  WBC 8.0  NEUTROABS 3.9  HGB 14.3  HCT 41.5  MCV 94.3  PLT 227   No results found for this basename: CKTOTAL, CKMB, CKMBINDEX, TROPONINI,  in the last 72 hours No results found for this basename: TSH, T4TOTAL, FREET3, T3FREE, THYROIDAB,  in the last 72 hours No results found for this basename: VITAMINB12, FOLATE, FERRITIN, TIBC, IRON, RETICCTPCT,  in the last 72 hours Lab Results  Component Value Date   HGBA1C 5.3 10/20/2013    The CrCl is unknown because both a height and weight (above a minimum accepted value) are required for this calculation. ABG No results found for this basename: phart, pco2, po2, hco3, tco2, acidbasedef, o2sat     No results found for this basename: DDIMER     Other results:   Cultures:    Component Value Date/Time   SDES NASOPHARYNGEAL 10/20/2013 1518   SPECREQUEST NONE 10/20/2013 1518   CULT  Value: NORMAL NASOPHARYNGEAL FLORA Performed at [facility] 10/20/2013 1518   REPTSTATUS 10/23/2013 FINAL 10/20/2013 1518       Radiological Exams on Admission: Dg Tibia/fibula Left  02/10/2014   CLINICAL DATA:  Mid left lower leg abscess.  EXAM: LEFT TIBIA AND FIBULA - 2 VIEW  COMPARISON:  None.  FINDINGS: Diffuse medial, lateral and posterior soft tissue swelling, most pronounced at the level of the ankle. Normal appearing bones.  IMPRESSION: Soft tissue swelling without underlying bony abnormality.   Electronically Signed   By: Gordan Payment M.D.   On: 02/10/2014 19:19    Chart has been reviewed  Assessment/Plan  38 yo M w hx of MRSA abscess in the past presents with left leg cellulitis and abscess  Present on Admission:  . Cellulitis  and abscess of leg - for IV antibiotics given low extremity swelling also evaluate for DVT patient is family history of DVTs in his father. Since apparently had self drained and currently is improving. Further attention deficit disorder. Continue- adderral as prescribed 20 mg PO q Day but would recommend changing to long-lasting if able to avoid repeated use through out the day. Watch for tachycardia given extensive use.   Prophylaxis: Lovenox   CODE STATUS: FULL CODE  Other plan as per orders.  I have spent a total of 55 min on this admission  Naliya Gish 02/10/2014, 8:50 PM

## 2014-02-10 NOTE — Progress Notes (Signed)
ANTIBIOTIC CONSULT NOTE - INITIAL  Pharmacy Consult for vancomycin Indication: cellulitis  No Known Allergies  Patient Measurements: Height: 6\' 2"  (188 cm) Weight: 252 lb 3.3 oz (114.4 kg) (patient states he is this weight) IBW/kg (Calculated) : 82.2  Vital Signs: Temp: 98.5 F (36.9 C) (03/14 2301) Temp src: Oral (03/14 2301) BP: 128/87 mmHg (03/14 2301) Pulse Rate: 90 (03/14 2301)  Labs:  Recent Labs  02/10/14 1840  WBC 8.0  HGB 14.3  PLT 227  CREATININE 0.75   Estimated Creatinine Clearance: 171.7 ml/min (by C-G formula based on Cr of 0.75).   Microbiology: No results found for this or any previous visit (from the past 720 hour(s)).  Medical History: Past Medical History  Diagnosis Date  . Back pain     Medications:  Prescriptions prior to admission  Medication Sig Dispense Refill  . ALPRAZolam (XANAX) 1 MG tablet Take 1 mg by mouth 4 (four) times daily as needed for anxiety.      Marland Kitchen. amphetamine-dextroamphetamine (ADDERALL) 20 MG tablet Take 20 mg by mouth daily.       Scheduled:  . [START ON 02/11/2014] amphetamine-dextroamphetamine  20 mg Oral Daily  . docusate sodium  100 mg Oral BID  . enoxaparin (LOVENOX) injection  40 mg Subcutaneous Q24H  . sodium chloride  3 mL Intravenous Q12H  . [START ON 02/11/2014] vancomycin  1,000 mg Intravenous Q8H    Assessment: 38yo male c/o LLE abscess x1wk with increasing pain and swelling, now w/ bloody drainage, has h/o MRSA, to begin IV ABX for cellulitis.  Goal of Therapy:  Vancomycin trough level 10-15 mcg/ml  Plan:  Will begin vancomycin 1000mg  IV Q8H and monitor CBC, Cx, levels prn.  Vernard GamblesVeronda Karys Meckley, PharmD, BCPS  02/10/2014,11:13 PM

## 2014-02-10 NOTE — ED Provider Notes (Signed)
CSN: [id number]     Arrival date & time 02/10/14  1741 History  This chart was scribed for non-physician practitioner Victorino Dike L. Noella Kipnis, PA-C, working with Raeford Razor, MD, by Yevette Edwards, ED Scribe. This patient was seen in room TR08C/TR08C and the patient's care was started at 6:03 PM.  None    Chief Complaint  Patient presents with  . Abscess    The history is provided by the patient. No language interpreter was used.   HPI Comments: Jesus Rivera is a 38 y.o. male who presents to the Emergency Department complaining of a suspected abscess to his left medial calf which has been present for approximately three weeks, but which increased in pain, redness, and swelling this morning. He states yesterday the site was not painful; however, he rates today's pain as 5/10. He also vocalizes drainage associated with the site today. The pt had a headache, nausea, emesis, and chills one day last week, and several days of chills. In the ED his temperature is 98.2 F. He denies any recent bites, hikes, walking barefoot in water, or recent injections into his joints. He reports a h/o abscesses to his leg, including one four months ago for which he was admitted for 4 days. He also reports a h/o MRSA and a suspected brown recuse bite.   He does not have a current PCP.  Past Medical History  Diagnosis Date  . Back pain    History reviewed. No pertinent past surgical history. Family History  Problem Relation Age of Onset  . Hypertension Mother   . Deep vein thrombosis Father    History  Substance Use Topics  . Smoking status: Current Every Day Smoker    Types: Cigarettes  . Smokeless tobacco: Never Used  . Alcohol Use: No    Review of Systems  Constitutional: Positive for chills. Negative for fever.  Gastrointestinal: Positive for nausea and vomiting.  Skin: Positive for color change and wound.  Neurological: Positive for headaches.  All other systems reviewed and are  negative.   Allergies  Review of patient's allergies indicates no known allergies.  Home Medications   Current Outpatient Rx  Name  Route  Sig  Dispense  Refill  . ALPRAZolam (XANAX) 1 MG tablet   Oral   Take 1 mg by mouth 4 (four) times daily as needed for anxiety.         Marland Kitchen amphetamine-dextroamphetamine (ADDERALL) 20 MG tablet   Oral   Take 20 mg by mouth daily.          Triage Vitals: BP 123/84  Pulse 102  Temp(Src) 98.2 F (36.8 C) (Oral)  Resp 20  SpO2 98%  Physical Exam  Nursing note and vitals reviewed. Constitutional: He is oriented to person, place, and time. He appears well-developed and well-nourished. No distress.  HENT:  Head: Normocephalic and atraumatic.  Right Ear: External ear normal.  Left Ear: External ear normal.  Nose: Nose normal.  Mouth/Throat: Oropharynx is clear and moist.  Eyes: Conjunctivae are normal.  Neck: Normal range of motion. Neck supple.  Cardiovascular: Normal rate, regular rhythm, normal heart sounds and intact distal pulses.   Pulmonary/Chest: Effort normal and breath sounds normal. No respiratory distress.  Abdominal: Soft.  Musculoskeletal: He exhibits tenderness.       Right ankle: Normal.       Left ankle: He exhibits swelling. Tenderness.       Right foot: Normal.       Feet:  Neurological: He  is alert and oriented to person, place, and time.  Skin: Skin is warm and dry. No rash noted. He is not diaphoretic. There is erythema (erythematous warm area noted on graphical documentation).     Psychiatric: He has a normal mood and affect.    ED Course  Procedures (including critical care time) Medications  clindamycin (CLEOCIN) IVPB 900 mg (0 mg Intravenous Stopped 02/10/14 1952)  morphine 4 MG/ML injection 4 mg (4 mg Intravenous Given 02/10/14 1845)  vancomycin (VANCOCIN) IVPB 1000 mg/200 mL premix (1,000 mg Intravenous New Bag/Given 02/10/14 2017)     DIAGNOSTIC STUDIES: Oxygen Saturation is 98% on room air, normal  by my interpretation.    COORDINATION OF CARE:  6:12 PM- Discussed treatment plan with patient, which includes IV antibiotics, pain medication, imaging, lab work, and possible admission, and the patient agreed to the plan.   8:21 PM- Rechecked pt. Informed pt of admission status.   Labs Review Labs Reviewed  CBC WITH DIFFERENTIAL  BASIC METABOLIC PANEL  DRUGS OF ABUSE SCREEN W/O ALC, ROUTINE URINE   Imaging Review Dg Tibia/fibula Left  02/10/2014   CLINICAL DATA:  Mid left lower leg abscess.  EXAM: LEFT TIBIA AND FIBULA - 2 VIEW  COMPARISON:  None.  FINDINGS: Diffuse medial, lateral and posterior soft tissue swelling, most pronounced at the level of the ankle. Normal appearing bones.  IMPRESSION: Soft tissue swelling without underlying bony abnormality.   Electronically Signed   By: Gordan PaymentSteve  Reid M.D.   On: 02/10/2014 19:19     EKG Interpretation None      MDM   Final diagnoses:  Cellulitis of leg   Filed Vitals:   02/10/14 1946  BP: 109/76  Pulse: 87  Temp: 98.1 F (36.7 C)  Resp: 20   Afebrile, NAD, non-toxic appearing, AAOx4.   Subjective fevers and chills at home. Suspect progressively worsening cellulitic infection to R lower extremity. PE reveals redness, swelling, mildly tender, warm to touch. Skin intact, No bleeding. No bullae. Non purulent. Non circumferential.  Borders are not elevated or sharply demarcated.  No evidence of occult abscess. R ankle is swollen and erythematous and warm. Labs obtained and reviewed. IV Clindamycin and Vancomycin started. Will admit patient for cellulitis. Patient d/w with Dr. Juleen ChinaKohut, agrees with plan.    I personally performed the services described in this documentation, which was scribed in my presence. The recorded information has been reviewed and is accurate.     Jeannetta EllisJennifer L Sparrow [place], PA-C 02/10/14 2216

## 2014-02-10 NOTE — ED Notes (Signed)
Pt presents to department for evaluation of L lower leg abscess. Ongoing x1 week. Pt states increased pain and swelling. Bloody drainage noted upon arrival. Pt is alert and oriented x4. 5/10 pain at the time.

## 2014-02-10 NOTE — ED Notes (Signed)
Labs obtained by Darin Engelsarla P. RN, IV start.

## 2014-02-11 DIAGNOSIS — M79609 Pain in unspecified limb: Secondary | ICD-10-CM

## 2014-02-11 DIAGNOSIS — M7989 Other specified soft tissue disorders: Secondary | ICD-10-CM

## 2014-02-11 LAB — COMPREHENSIVE METABOLIC PANEL
ALT: 135 U/L — AB (ref 0–53)
AST: 120 U/L — ABNORMAL HIGH (ref 0–37)
Albumin: 3.6 g/dL (ref 3.5–5.2)
Alkaline Phosphatase: 154 U/L — ABNORMAL HIGH (ref 39–117)
BILIRUBIN TOTAL: 0.2 mg/dL — AB (ref 0.3–1.2)
BUN: 7 mg/dL (ref 6–23)
CO2: 23 meq/L (ref 19–32)
Calcium: 9 mg/dL (ref 8.4–10.5)
Chloride: 100 mEq/L (ref 96–112)
Creatinine, Ser: 0.65 mg/dL (ref 0.50–1.35)
GLUCOSE: 121 mg/dL — AB (ref 70–99)
POTASSIUM: 4.6 meq/L (ref 3.7–5.3)
Sodium: 139 mEq/L (ref 137–147)
TOTAL PROTEIN: 6.7 g/dL (ref 6.0–8.3)

## 2014-02-11 LAB — TSH: TSH: 1.852 u[IU]/mL (ref 0.350–4.500)

## 2014-02-11 LAB — CBC
HCT: 44.1 % (ref 39.0–52.0)
Hemoglobin: 14.9 g/dL (ref 13.0–17.0)
MCH: 32.2 pg (ref 26.0–34.0)
MCHC: 33.8 g/dL (ref 30.0–36.0)
MCV: 95.2 fL (ref 78.0–100.0)
PLATELETS: 225 10*3/uL (ref 150–400)
RBC: 4.63 MIL/uL (ref 4.22–5.81)
RDW: 13 % (ref 11.5–15.5)
WBC: 6.4 10*3/uL (ref 4.0–10.5)

## 2014-02-11 LAB — PHOSPHORUS: Phosphorus: 4.1 mg/dL (ref 2.3–4.6)

## 2014-02-11 LAB — MAGNESIUM: MAGNESIUM: 2.2 mg/dL (ref 1.5–2.5)

## 2014-02-11 NOTE — Progress Notes (Signed)
Left lower extremity venous duplex completed.  Left:  No evidence of DVT, superficial thrombosis, or Baker's cyst.  Right:  Negative for DVT in the common femoral vein.  

## 2014-02-11 NOTE — Progress Notes (Addendum)
PATIENT DETAILS Name: Jesus RobertsDarrell R Rivera Age: 38 y.o. Sex: male Date of Birth: 11/07/1976 Admit Date: 02/10/2014 Admitting Physician Therisa DoyneAnastassia Doutova, MD HYQ:[id number],NGEXPCP:THOMAS,BRAD, MD  Subjective: No major complaints  Assessment/Plan: Active Problems:   Cellulitis and abscess of leg -Patient admitted with a pustule in left post calf area-that spontaneously drained, but had worsening pain/swelling. Patient was admitted and started on Vancomycin. -Per pt, swelling in the left leg is significantly better,but still has erythema around the pustule-essentially unchanged and well within the demarcated area. -Continue with IV Vancomycin-day 2 -continue to elevate left leg at all times  Elevated LFT's -?etiology -repeat in am, if still persistent will commence further work up  ADD -stable -c/w Adderal  Disposition: Remain inpatient  DVT Prophylaxis: Prophylactic Lovenox  Code Status: Full code   Family Communication None at bedside   Procedures:  None  CONSULTS:  None  Time spent 40 minutes-which includes 50% of the time with face-to-face with patient/ family and coordinating care related to the above assessment and plan.    MEDICATIONS: Scheduled Meds: . amphetamine-dextroamphetamine  20 mg Oral Daily  . docusate sodium  100 mg Oral BID  . enoxaparin (LOVENOX) injection  40 mg Subcutaneous Q24H  . sodium chloride  3 mL Intravenous Q12H  . vancomycin  1,000 mg Intravenous Q8H   Continuous Infusions:  PRN Meds:.sodium chloride, acetaminophen, acetaminophen, ALPRAZolam, HYDROcodone-acetaminophen, morphine injection, ondansetron (ZOFRAN) IV, ondansetron, sodium chloride  Antibiotics: Anti-infectives   Start     Dose/Rate Route Frequency Ordered Stop   02/11/14 0400  vancomycin (VANCOCIN) IVPB 1000 mg/200 mL premix     1,000 mg 200 mL/hr over 60 Minutes Intravenous Every 8 hours 02/10/14 2313     02/10/14 2015  vancomycin (VANCOCIN) IVPB 1000 mg/200 mL premix     1,000 mg 200 mL/hr over 60 Minutes Intravenous  Once 02/10/14 2008 02/10/14 2130   02/10/14 1815  clindamycin (CLEOCIN) IVPB 900 mg     900 mg 100 mL/hr over 30 Minutes Intravenous  Once 02/10/14 1813 02/10/14 1952       PHYSICAL EXAM: Vital signs in last 24 hours: Filed Vitals:   02/10/14 1754 02/10/14 1946 02/10/14 2301 02/11/14 0437  BP: 123/84 109/76 128/87 109/61  Pulse: 102 87 90 78  Temp: 98.2 F (36.8 C) 98.1 F (36.7 C) 98.5 F (36.9 C) 97.8 F (36.6 C)  TempSrc: Oral Oral Oral Oral  Resp: 20 20 20 20   Height:   6\' 2"  (1.88 m)   Weight:   114.4 kg (252 lb 3.3 oz)   SpO2: 98% 97% 98% 96%    Weight change:  Filed Weights   02/10/14 2301  Weight: 114.4 kg (252 lb 3.3 oz)   Body mass index is 32.37 kg/(m^2).   Gen Exam: Awake and alert with clear speech.   Neck: Supple, No JVD.   Chest: B/L Clear.   CVS: S1 S2 Regular, no murmurs.  Abdomen: soft, BS +, non tender, non distended.  Extremities: no edema, lower extremities warm to touch.Left calf-post aspect-erythema within the marked area, decreased swelling per patient Neurologic: Non Focal.   Skin: No Rash.  Wounds: N/A.    Intake/Output from previous day:  Intake/Output Summary (Last 24 hours) at 02/11/14 0825 Last data filed at 02/11/14 0600  Gross per 24 hour  Intake    970 ml  Output    550 ml  Net    420 ml     LAB RESULTS: CBC  Recent Labs Lab 02/10/14 1840 02/11/14 0600  WBC 8.0 6.4  HGB 14.3 14.9  HCT 41.5 44.1  PLT 227 225  MCV 94.3 95.2  MCH 32.5 32.2  MCHC 34.5 33.8  RDW 12.9 13.0  LYMPHSABS 2.8  --   MONOABS 0.8  --   EOSABS 0.4  --   BASOSABS 0.0  --     Chemistries   Recent Labs Lab 02/10/14 1840 02/11/14 0600  NA 139 139  K 3.8 4.6  CL 100 100  CO2 28 23  GLUCOSE 81 121*  BUN 9 7  CREATININE 0.75 0.65  CALCIUM 9.2 9.0  MG  --  2.2    CBG: No results found for this basename: GLUCAP,  in the last 168 hours  GFR Estimated Creatinine Clearance: 171.7  ml/min (by C-G formula based on Cr of 0.65).  Coagulation profile No results found for this basename: INR, PROTIME,  in the last 168 hours  Cardiac Enzymes No results found for this basename: CK, CKMB, TROPONINI, MYOGLOBIN,  in the last 168 hours  No components found with this basename: POCBNP,  No results found for this basename: DDIMER,  in the last 72 hours No results found for this basename: HGBA1C,  in the last 72 hours No results found for this basename: CHOL, HDL, LDLCALC, TRIG, CHOLHDL, LDLDIRECT,  in the last 72 hours No results found for this basename: TSH, T4TOTAL, FREET3, T3FREE, THYROIDAB,  in the last 72 hours No results found for this basename: VITAMINB12, FOLATE, FERRITIN, TIBC, IRON, RETICCTPCT,  in the last 72 hours No results found for this basename: LIPASE, AMYLASE,  in the last 72 hours  Urine Studies No results found for this basename: UACOL, UAPR, USPG, UPH, UTP, UGL, UKET, UBIL, UHGB, UNIT, UROB, ULEU, UEPI, UWBC, URBC, UBAC, CAST, CRYS, UCOM, BILUA,  in the last 72 hours  MICROBIOLOGY: No results found for this or any previous visit (from the past 240 hour(s)).  RADIOLOGY STUDIES/RESULTS: Dg Tibia/fibula Left  02/10/2014   CLINICAL DATA:  Mid left lower leg abscess.  EXAM: LEFT TIBIA AND FIBULA - 2 VIEW  COMPARISON:  None.  FINDINGS: Diffuse medial, lateral and posterior soft tissue swelling, most pronounced at the level of the ankle. Normal appearing bones.  IMPRESSION: Soft tissue swelling without underlying bony abnormality.   Electronically Signed   By: Gordan Payment M.D.   On: 02/10/2014 19:19    Jeoffrey Massed, MD  [facility] Pager:[phone]  If 7PM-7AM, please contact night-coverage [email or web address] Password [email or web address] 02/11/2014, 8:25 AM   LOS: 1 day

## 2014-02-12 DIAGNOSIS — R7989 Other specified abnormal findings of blood chemistry: Secondary | ICD-10-CM

## 2014-02-12 DIAGNOSIS — R945 Abnormal results of liver function studies: Secondary | ICD-10-CM

## 2014-02-12 LAB — CBC
HEMATOCRIT: 42.9 % (ref 39.0–52.0)
HEMOGLOBIN: 14.4 g/dL (ref 13.0–17.0)
MCH: 31.9 pg (ref 26.0–34.0)
MCHC: 33.6 g/dL (ref 30.0–36.0)
MCV: 95.1 fL (ref 78.0–100.0)
Platelets: 233 10*3/uL (ref 150–400)
RBC: 4.51 MIL/uL (ref 4.22–5.81)
RDW: 12.9 % (ref 11.5–15.5)
WBC: 6.1 10*3/uL (ref 4.0–10.5)

## 2014-02-12 LAB — HEPATITIS PANEL, ACUTE
HCV AB: NEGATIVE
HEP B C IGM: NONREACTIVE
HEP B S AG: NEGATIVE
Hep A IgM: NONREACTIVE

## 2014-02-12 LAB — BASIC METABOLIC PANEL
BUN: 8 mg/dL (ref 6–23)
CO2: 24 mEq/L (ref 19–32)
Calcium: 9.4 mg/dL (ref 8.4–10.5)
Chloride: 105 mEq/L (ref 96–112)
Creatinine, Ser: 0.64 mg/dL (ref 0.50–1.35)
GFR calc Af Amer: >90 mL/min (ref >90)
Glucose, Bld: 112 mg/dL — ABNORMAL HIGH (ref 70–99)
POTASSIUM: 4.6 meq/L (ref 3.7–5.3)
SODIUM: 140 meq/L (ref 137–147)

## 2014-02-12 LAB — HEPATIC FUNCTION PANEL
ALT: 184 U/L — ABNORMAL HIGH (ref 0–53)
AST: 123 U/L — AB (ref 0–37)
Albumin: 3.2 g/dL — ABNORMAL LOW (ref 3.5–5.2)
Alkaline Phosphatase: 146 U/L — ABNORMAL HIGH (ref 39–117)
Bilirubin, Direct: <0.2 mg/dL (ref 0.0–0.3)
TOTAL PROTEIN: 6.4 g/dL (ref 6.0–8.3)
Total Bilirubin: <0.2 mg/dL — ABNORMAL LOW (ref 0.3–1.2)

## 2014-02-12 MED ORDER — OXYCODONE HCL 5 MG PO TABS: 5.0000 mg | ORAL_TABLET | Freq: Four times a day (QID) | ORAL | Status: DC | PRN

## 2014-02-12 MED ORDER — CLINDAMYCIN HCL 300 MG PO CAPS: 600.0000 mg | ORAL_CAPSULE | Freq: Three times a day (TID) | ORAL | Status: DC

## 2014-02-12 NOTE — Care Management Note (Signed)
    Page 1 of 1   02/12/2014     11:36:43 AM   CARE MANAGEMENT NOTE 02/12/2014  Patient:  Jesus Rivera,Jesus R   Account Number:  [id number]  Date Initiated:  02/12/2014  Documentation initiated by:  Letha CapeAYLOR,Rupa Lagan  Subjective/Objective Assessment:   dx cellulitis  admit- lives with family, pta indep.     Action/Plan:   Anticipated DC Date:  02/12/2014   Anticipated DC Plan:  HOME/SELF CARE      DC Planning Services  CM consult      Choice offered to / List presented to:             Status of service:  Completed, signed off Medicare Important Message given?   (If response is "NO", the following Medicare IM given date fields will be blank) Date Medicare IM given:   Date Additional Medicare IM given:    Discharge Disposition:  HOME/SELF CARE  Per UR Regulation:  Reviewed for med. necessity/level of care/duration of stay  If discussed at Long Length of Stay Meetings, dates discussed:    Comments:  02/12/14 1133 Letha Capeeborah Zuleyma Scharf RN, BSN [phone] patient is from home with family, patient for dc today, patient dc on oxycodone and clindamycin #18 capsules,  NCM called [facility] and the price for clidamycin is $45, patient states he could afford this medication while NCM and RN, Earnestine MealingSeirra was  in the room.

## 2014-02-12 NOTE — Discharge Instructions (Signed)
AVOID TYLENOL and ALCOHOL  PLEASE HAVE YOUR PRIMARY CARE MD-FOLLOW LFT'S AND HEPATITIS SEROLOGY AT NEXT VISIT.

## 2014-02-12 NOTE — Discharge Summary (Signed)
PATIENT DETAILS Name: Jesus Rivera Age: 38 y.o. Sex: male Date of Birth: [birth year] MRN: [id number]. Admit Date: 02/10/2014 Admitting Physician: Therisa Doyne, MD EXB:[id number], Barnie Alderman, MD  Recommendations for Outpatient Follow-up:  1. Follow acute hepatitis serology-done on day of discharge 2. Follow wound cultures-pending at the time of discharge 3. If LFTs still persistently elevated, we'll need further workup-deferred to the outpatient setting  PRIMARY DISCHARGE DIAGNOSIS:  Active Problems:   Cellulitis and abscess of leg    Elevated liver enzymes      PAST MEDICAL HISTORY: Past Medical History  Diagnosis Date  . Back pain     DISCHARGE MEDICATIONS:   Medication List         ALPRAZolam 1 MG tablet  Commonly known as:  XANAX  Take 1 mg by mouth 4 (four) times daily as needed for anxiety.     amphetamine-dextroamphetamine 20 MG tablet  Commonly known as:  ADDERALL  Take 20 mg by mouth daily.     clindamycin 300 MG capsule  Commonly known as:  CLEOCIN  Take 2 capsules (600 mg total) by mouth 3 (three) times daily.     oxyCODONE 5 MG immediate release tablet  Commonly known as:  ROXICODONE  Take 1 tablet (5 mg total) by mouth every 6 (six) hours as needed for moderate pain.        ALLERGIES:  No Known Allergies  BRIEF HPI:  See H&P, Labs, Consult and Test reports for all details in brief, patient was admitted for left leg erythema and swelling after a small left posterior limb the left posterior cough area spontaneously drained.  CONSULTATIONS:   None  PERTINENT RADIOLOGIC STUDIES: Dg Tibia/fibula Left  02/10/2014   CLINICAL DATA:  Mid left lower leg abscess.  EXAM: LEFT TIBIA AND FIBULA - 2 VIEW  COMPARISON:  None.  FINDINGS: Diffuse medial, lateral and posterior soft tissue swelling, most pronounced at the level of the ankle. Normal appearing bones.  IMPRESSION: Soft tissue swelling without underlying bony abnormality.   Electronically Signed    By: Gordan Payment M.D.   On: 02/10/2014 19:19     PERTINENT LAB RESULTS: CBC:  Recent Labs  02/11/14 0600 02/12/14 0539  WBC 6.4 6.1  HGB 14.9 14.4  HCT 44.1 42.9  PLT 225 233   CMET CMP     Component Value Date/Time   NA 140 02/12/2014 0539   K 4.6 02/12/2014 0539   CL 105 02/12/2014 0539   CO2 24 02/12/2014 0539   GLUCOSE 112* 02/12/2014 0539   BUN 8 02/12/2014 0539   CREATININE 0.64 02/12/2014 0539   CALCIUM 9.4 02/12/2014 0539   PROT 6.4 02/12/2014 0539   ALBUMIN 3.2* 02/12/2014 0539   AST 123* 02/12/2014 0539   ALT 184* 02/12/2014 0539   ALKPHOS 146* 02/12/2014 0539   BILITOT <0.2* 02/12/2014 0539   GFRNONAA >90 02/12/2014 0539   GFRAA >90 02/12/2014 0539    GFR Estimated Creatinine Clearance: 171.7 ml/min (by C-G formula based on Cr of 0.64). No results found for this basename: LIPASE, AMYLASE,  in the last 72 hours No results found for this basename: CKTOTAL, CKMB, CKMBINDEX, TROPONINI,  in the last 72 hours No components found with this basename: POCBNP,  No results found for this basename: DDIMER,  in the last 72 hours No results found for this basename: HGBA1C,  in the last 72 hours No results found for this basename: CHOL, HDL, LDLCALC, TRIG, CHOLHDL, LDLDIRECT,  in the last 72  hours  Recent Labs  02/11/14 0600  TSH 1.852   No results found for this basename: VITAMINB12, FOLATE, FERRITIN, TIBC, IRON, RETICCTPCT,  in the last 72 hours Coags: No results found for this basename: PT, INR,  in the last 72 hours Microbiology: Recent Results (from the past 240 hour(s))  WOUND CULTURE     Status: None   Collection Time    02/10/14 11:22 PM      Result Value Ref Range Status   Specimen Description WOUND LEG   Final   Special Requests Normal   Final   Gram Stain     Final   Value: RARE WBC PRESENT, PREDOMINANTLY PMN     RARE SQUAMOUS EPITHELIAL CELLS PRESENT     RARE GRAM POSITIVE COCCI IN PAIRS     Performed at [facility]   Culture     Final   Value: FEW  STAPHYLOCOCCUS AUREUS     Performed at [facility]   Report Status PENDING   Incomplete     BRIEF HOSPITAL COURSE:  Cellulitis and abscess of leg  -Patient admitted with a pustule in left post calf area-that spontaneously drained, but had worsening pain/swelling. Patient was admitted and started on Vancomycin. With vancomycin therapy, significantly improved, swelling has almost resolved, very minimal erythema still persists. Please note, this area was marked on admission, erythema were within the marked area on discharge. One culture, preliminary results show staph aureus. Patient will be transitioned to clindamycin on discharge to complete a seven-day course. - Please note, x-ray of the left leg showed mild soft tissue swelling, ultrasound and Doppler was negative for DVT. - Follow up appointment with his primary care practitioner has been made for this coming Friday at 1:45 PM, patient has been aware and has been asked to keep this appointment. Patient also has been asked to seek immediate medical attention if persistently febrile, if the concerned area in his left posterior calf were to worsen with worsening swelling, and erythema.  Elevated liver function enzymes - Unknown etiology, will check acute hepatitis serology prior to discharge, this will need to be followed up by patient's primary care practitioner.  ADD  -stable  -c/w Adderal  TODAY-DAY OF DISCHARGE:  Subjective:   Jesus Rivera today has no headache,no chest abdominal pain,no new weakness tingling or numbness, feels much better wants to go home today.   Objective:   Blood pressure 95/56, pulse 73, temperature 97.5 F (36.4 C), temperature source Oral, resp. rate 20, height 6\' 2"  (1.88 m), weight 114.4 kg (252 lb 3.3 oz), SpO2 98.00%.  Intake/Output Summary (Last 24 hours) at 02/12/14 1041 Last data filed at 02/12/14 0900  Gross per 24 hour  Intake    800 ml  Output      0 ml  Net    800 ml   Filed  Weights   02/10/14 2301  Weight: 114.4 kg (252 lb 3.3 oz)    Exam Awake Alert, Oriented *3, No new F.N deficits, Normal affect [place].AT,PERRAL Supple Neck,No JVD, No cervical lymphadenopathy appriciated.  Symmetrical Chest wall movement, Good air movement bilaterally, CTAB RRR,No Gallops,Rubs or new Murmurs, No Parasternal Heave +ve B.Sounds, Abd Soft, Non tender, No organomegaly appriciated, No rebound -guarding or rigidity. No Cyanosis, Clubbing or edema, No new Rash or bruise  DISCHARGE CONDITION: Stable  DISPOSITION: Home  DISCHARGE INSTRUCTIONS:    Activity:  As tolerated   Diet recommendation: Regular Diet      Discharge Orders  Future Orders Complete By Expires   Call MD for:  redness, tenderness, or signs of infection (pain, swelling, redness, odor or green/yellow discharge around incision site)  As directed    Call MD for:  temperature >100.4  As directed    Diet general  As directed    Discharge wound care:  As directed    Comments:     Wet to dry dressing daily   Increase activity slowly  As directed       Follow-up Information   Follow up with Robb MatarHOMAS, MILLARD, MD On 02/16/2014. (appt 1:45 pm)    Specialty:  Family Medicine   Contact information:   [address] [place] [address] [phone]       Total Time spent on discharge equals 45 minutes.  SignedJeoffrey Massed: GHIMIRE,SHANKER 02/12/2014 10:41 AM

## 2014-02-12 NOTE — Progress Notes (Signed)
Cherlyn Robertsarrell R Hornung to be D/C'd Home per MD order.  Discussed with the patient and all questions fully answered.    Medication List         ALPRAZolam 1 MG tablet  Commonly known as:  XANAX  Take 1 mg by mouth 4 (four) times daily as needed for anxiety.     amphetamine-dextroamphetamine 20 MG tablet  Commonly known as:  ADDERALL  Take 20 mg by mouth daily.     clindamycin 300 MG capsule  Commonly known as:  CLEOCIN  Take 2 capsules (600 mg total) by mouth 3 (three) times daily.     oxyCODONE 5 MG immediate release tablet  Commonly known as:  ROXICODONE  Take 1 tablet (5 mg total) by mouth every 6 (six) hours as needed for moderate pain.        VVS, Skin clean, dry and intact without evidence of skin break down, no evidence of skin tears noted. IV catheter discontinued intact. Site without signs and symptoms of complications. Dressing and pressure applied.  An After Visit Summary was printed and given to the patient.  D/c education completed with patient/family including follow up instructions, medication list, d/c activities limitations if indicated, with other d/c instructions as indicated by MD - patient able to verbalize understanding, all questions fully answered.   Patient instructed to return to ED, call 911, or call MD for any changes in condition.   Patient escorted via WC, and D/C home via private auto.  Kennyth ArnoldJOHNSON, Taniela Feltus D 02/12/2014 12:06 PM

## 2014-02-13 LAB — DRUGS OF ABUSE SCREEN W/O ALC, ROUTINE URINE
Amphetamine Screen, Ur: NEGATIVE
BARBITURATE QUANT UR: NEGATIVE
BENZODIAZEPINES.: POSITIVE — AB
Cocaine Metabolites: NEGATIVE
Creatinine,U: 91.7 mg/dL
MARIJUANA METABOLITE: NEGATIVE
Methadone: NEGATIVE
Opiate Screen, Urine: POSITIVE — AB
PHENCYCLIDINE (PCP): NEGATIVE
Propoxyphene: NEGATIVE

## 2014-02-13 NOTE — ED Provider Notes (Signed)
Medical screening examination/treatment/procedure(s) were performed by non-physician practitioner and as supervising physician I was immediately available for consultation/collaboration.   EKG Interpretation None       Ary Lavine, MD 02/13/14 2312 

## 2014-02-14 LAB — WOUND CULTURE: SPECIAL REQUESTS: NORMAL

## 2014-02-15 LAB — OPIATE, QUANTITATIVE, URINE
6 Monoacetylmorphine, Ur-Confirm: NEGATIVE ng/mL
Codeine Urine: NEGATIVE ng/mL
HYDROMORPHONE GC/MS CONF: NEGATIVE ng/mL
Hydrocodone: NEGATIVE ng/mL
Morphine, Confirm: 1015 ng/mL
Norhydrocodone, Ur: NEGATIVE ng/mL
Noroxycodone, Ur: 2787 ng/mL
OXYCODONE, UR: 8620 ng/mL
Oxymorphone: 6437 ng/mL

## 2014-02-15 LAB — BENZODIAZEPINE, QUANTITATIVE, URINE
ALPRAZOLAMU: 837 ng/mL
CLONAZEPAU: NEGATIVE ng/mL
Diazepam (GC/LC/MS), ur confirm: NEGATIVE ng/mL
Estazolam (GC/LC/MS), ur confirm: NEGATIVE ng/mL
Flunitrazepam metabolite (GC/LC/MS), ur confirm: NEGATIVE ng/mL
Flurazepam GC/MS Conf: NEGATIVE ng/mL
HYDROXYALPRAZ UR: 501 ng/mL
LORAZEPAMU: NEGATIVE ng/mL
MIDAZOLAMU: NEGATIVE ng/mL
Nordiazepam GC/MS Conf: NEGATIVE ng/mL
OXAZEPAM GC/MS CONF: NEGATIVE ng/mL
TEMAZEPAM GC/MS CONF: NEGATIVE ng/mL
Triazolam metabolite (GC/LC/MS), ur confirm: NEGATIVE ng/mL

## 2017-02-05 ENCOUNTER — Emergency Department (HOSPITAL_COMMUNITY): Payer: Self-pay | Admitting: Certified Registered Nurse Anesthetist

## 2017-02-05 ENCOUNTER — Encounter (HOSPITAL_COMMUNITY): Payer: Self-pay | Admitting: Emergency Medicine

## 2017-02-05 ENCOUNTER — Encounter (HOSPITAL_COMMUNITY)
Admission: EM | Disposition: A | Discharge status: Home / Self Care | Payer: Self-pay | Source: Home / Self Care | Attending: Emergency Medicine

## 2017-02-05 ENCOUNTER — Emergency Department (HOSPITAL_COMMUNITY)
Admission: EM | Admit: 2017-02-05 | Discharge: 2017-02-05 | Disposition: A | Discharge status: Home / Self Care | Payer: Self-pay | Attending: Emergency Medicine | Admitting: Emergency Medicine

## 2017-02-05 ENCOUNTER — Emergency Department (HOSPITAL_COMMUNITY): Payer: Self-pay

## 2017-02-05 DIAGNOSIS — L0291 Cutaneous abscess, unspecified: Secondary | ICD-10-CM

## 2017-02-05 DIAGNOSIS — Z1611 Resistance to penicillins: Secondary | ICD-10-CM | POA: Insufficient documentation

## 2017-02-05 DIAGNOSIS — Z8249 Family history of ischemic heart disease and other diseases of the circulatory system: Secondary | ICD-10-CM | POA: Insufficient documentation

## 2017-02-05 DIAGNOSIS — M549 Dorsalgia, unspecified: Secondary | ICD-10-CM | POA: Insufficient documentation

## 2017-02-05 DIAGNOSIS — F17210 Nicotine dependence, cigarettes, uncomplicated: Secondary | ICD-10-CM | POA: Insufficient documentation

## 2017-02-05 DIAGNOSIS — Z1623 Resistance to quinolones and fluoroquinolones: Secondary | ICD-10-CM | POA: Insufficient documentation

## 2017-02-05 DIAGNOSIS — F419 Anxiety disorder, unspecified: Secondary | ICD-10-CM | POA: Insufficient documentation

## 2017-02-05 DIAGNOSIS — B9562 Methicillin resistant Staphylococcus aureus infection as the cause of diseases classified elsewhere: Secondary | ICD-10-CM | POA: Insufficient documentation

## 2017-02-05 DIAGNOSIS — L02512 Cutaneous abscess of left hand: Secondary | ICD-10-CM | POA: Insufficient documentation

## 2017-02-05 DIAGNOSIS — F909 Attention-deficit hyperactivity disorder, unspecified type: Secondary | ICD-10-CM | POA: Insufficient documentation

## 2017-02-05 HISTORY — PX: I & D EXTREMITY: SHX5045

## 2017-02-05 HISTORY — DX: Anxiety disorder, unspecified: F41.9

## 2017-02-05 HISTORY — DX: Attention-deficit hyperactivity disorder, unspecified type: F90.9

## 2017-02-05 LAB — CBC WITH DIFFERENTIAL/PLATELET
Basophils Absolute: 0 10*3/uL (ref 0.0–0.1)
Basophils Relative: 0 %
Eosinophils Absolute: 0.4 10*3/uL (ref 0.0–0.7)
Eosinophils Relative: 5 %
HCT: 44.7 % (ref 39.0–52.0)
HEMOGLOBIN: 14.8 g/dL (ref 13.0–17.0)
LYMPHS PCT: 38 %
Lymphs Abs: 3.4 10*3/uL (ref 0.7–4.0)
MCH: 31.5 pg (ref 26.0–34.0)
MCHC: 33.1 g/dL (ref 30.0–36.0)
MCV: 95.1 fL (ref 78.0–100.0)
MONO ABS: 0.7 10*3/uL (ref 0.1–1.0)
MONOS PCT: 8 %
NEUTROS ABS: 4.3 10*3/uL (ref 1.7–7.7)
NEUTROS PCT: 49 %
Platelets: 244 10*3/uL (ref 150–400)
RBC: 4.7 MIL/uL (ref 4.22–5.81)
RDW: 12.7 % (ref 11.5–15.5)
WBC: 8.9 10*3/uL (ref 4.0–10.5)

## 2017-02-05 LAB — BASIC METABOLIC PANEL
ANION GAP: 7 (ref 5–15)
BUN: 6 mg/dL (ref 6–20)
CO2: 28 mmol/L (ref 22–32)
CREATININE: 0.69 mg/dL (ref 0.61–1.24)
Calcium: 9.2 mg/dL (ref 8.9–10.3)
Chloride: 103 mmol/L (ref 101–111)
GFR calc Af Amer: >60 mL/min (ref >60)
GFR calc non Af Amer: >60 mL/min (ref >60)
GLUCOSE: 89 mg/dL (ref 65–99)
Potassium: 3.9 mmol/L (ref 3.5–5.1)
Sodium: 138 mmol/L (ref 135–145)

## 2017-02-05 SURGERY — IRRIGATION AND DEBRIDEMENT EXTREMITY
Anesthesia: Monitor Anesthesia Care | Site: Finger | Laterality: Left

## 2017-02-05 MED ORDER — FENTANYL CITRATE (PF) 100 MCG/2ML IJ SOLN
INTRAMUSCULAR | Status: DC | PRN
  Administered 2017-02-05: 100 ug via INTRAVENOUS

## 2017-02-05 MED ORDER — PROPOFOL 10 MG/ML IV BOLUS
INTRAVENOUS | Status: AC
  Filled 2017-02-05: qty 20

## 2017-02-05 MED ORDER — HYDROCODONE-ACETAMINOPHEN 5-325 MG PO TABS
1.0000 | ORAL_TABLET | Freq: Once | ORAL | Status: AC
  Administered 2017-02-05: 1 via ORAL
  Filled 2017-02-05: qty 1

## 2017-02-05 MED ORDER — 0.9 % SODIUM CHLORIDE (POUR BTL) OPTIME
TOPICAL | Status: DC | PRN
  Administered 2017-02-05: 1000 mL

## 2017-02-05 MED ORDER — SULFAMETHOXAZOLE-TRIMETHOPRIM 800-160 MG PO TABS: 1.0000 | ORAL_TABLET | Freq: Two times a day (BID) | ORAL | 0 refills | Status: DC

## 2017-02-05 MED ORDER — BUPIVACAINE HCL (PF) 0.25 % IJ SOLN
INTRAMUSCULAR | Status: AC
  Filled 2017-02-05: qty 30

## 2017-02-05 MED ORDER — HYDROMORPHONE HCL 1 MG/ML IJ SOLN
INTRAMUSCULAR | Status: AC
  Filled 2017-02-05: qty 0.5

## 2017-02-05 MED ORDER — LIDOCAINE HCL (CARDIAC) 20 MG/ML IV SOLN
INTRAVENOUS | Status: DC | PRN
  Administered 2017-02-05: 100 mg via INTRAVENOUS

## 2017-02-05 MED ORDER — BUPIVACAINE HCL (PF) 0.25 % IJ SOLN
INTRAMUSCULAR | Status: DC | PRN
  Administered 2017-02-05: 7 mL

## 2017-02-05 MED ORDER — GLYCOPYRROLATE 0.2 MG/ML IJ SOLN
INTRAMUSCULAR | Status: DC | PRN
  Administered 2017-02-05: 0.2 mg via INTRAVENOUS

## 2017-02-05 MED ORDER — MIDAZOLAM HCL 2 MG/2ML IJ SOLN
INTRAMUSCULAR | Status: AC
  Filled 2017-02-05: qty 2

## 2017-02-05 MED ORDER — HYDROMORPHONE HCL 1 MG/ML IJ SOLN
INTRAMUSCULAR | Status: DC
Start: 2017-02-05 — End: 2017-02-05
  Filled 2017-02-05: qty 0.5

## 2017-02-05 MED ORDER — OXYCODONE HCL 5 MG PO TABS: ORAL_TABLET | ORAL | 0 refills | Status: DC

## 2017-02-05 MED ORDER — ONDANSETRON HCL 4 MG/2ML IJ SOLN
INTRAMUSCULAR | Status: DC | PRN
  Administered 2017-02-05: 4 mg via INTRAVENOUS

## 2017-02-05 MED ORDER — HYDROMORPHONE HCL 1 MG/ML IJ SOLN
0.2500 mg | INTRAMUSCULAR | Status: DC | PRN
  Administered 2017-02-05 (×2): 0.5 mg via INTRAVENOUS

## 2017-02-05 MED ORDER — LACTATED RINGERS IV SOLN
INTRAVENOUS | Status: DC
  Administered 2017-02-05 (×2): via INTRAVENOUS

## 2017-02-05 MED ORDER — CEFAZOLIN SODIUM 1 G IJ SOLR
INTRAMUSCULAR | Status: DC | PRN
  Administered 2017-02-05: 2 g via INTRAMUSCULAR

## 2017-02-05 MED ORDER — MIDAZOLAM HCL 5 MG/5ML IJ SOLN
INTRAMUSCULAR | Status: DC | PRN
  Administered 2017-02-05: 2 mg via INTRAVENOUS

## 2017-02-05 MED ORDER — PROPOFOL 10 MG/ML IV BOLUS
INTRAVENOUS | Status: DC | PRN
Start: 2017-02-05 — End: 2017-02-05
  Administered 2017-02-05: 200 mg via INTRAVENOUS

## 2017-02-05 MED ORDER — MORPHINE SULFATE (PF) 4 MG/ML IV SOLN
4.0000 mg | Freq: Once | INTRAVENOUS | Status: AC
  Administered 2017-02-05: 4 mg via INTRAVENOUS
  Filled 2017-02-05: qty 1

## 2017-02-05 MED ORDER — FENTANYL CITRATE (PF) 100 MCG/2ML IJ SOLN
INTRAMUSCULAR | Status: AC
  Filled 2017-02-05: qty 2

## 2017-02-05 MED ORDER — SUCCINYLCHOLINE CHLORIDE 20 MG/ML IJ SOLN
INTRAMUSCULAR | Status: DC | PRN
  Administered 2017-02-05: 160 mg via INTRAVENOUS

## 2017-02-05 SURGICAL SUPPLY — 52 items
BANDAGE ACE 4X5 VEL STRL LF (GAUZE/BANDAGES/DRESSINGS) ×1 IMPLANT
BANDAGE COBAN STERILE 2 (GAUZE/BANDAGES/DRESSINGS) ×2 IMPLANT
BANDAGE ELASTIC 3 VELCRO ST LF (GAUZE/BANDAGES/DRESSINGS) ×3 IMPLANT
BNDG CMPR 9X4 STRL LF SNTH (GAUZE/BANDAGES/DRESSINGS) ×1
BNDG COHESIVE 1X5 TAN STRL LF (GAUZE/BANDAGES/DRESSINGS) IMPLANT
BNDG COHESIVE 3X5 TAN STRL LF (GAUZE/BANDAGES/DRESSINGS) ×2 IMPLANT
BNDG CONFORM 2 STRL LF (GAUZE/BANDAGES/DRESSINGS) ×2 IMPLANT
BNDG ESMARK 4X9 LF (GAUZE/BANDAGES/DRESSINGS) ×2 IMPLANT
BNDG GAUZE ELAST 4 BULKY (GAUZE/BANDAGES/DRESSINGS) ×3 IMPLANT
CORDS BIPOLAR (ELECTRODE) ×3 IMPLANT
COVER SURGICAL LIGHT HANDLE (MISCELLANEOUS) ×3 IMPLANT
CUFF TOURNIQUET SINGLE 24IN (TOURNIQUET CUFF) ×2 IMPLANT
DECANTER SPIKE VIAL GLASS SM (MISCELLANEOUS) ×3 IMPLANT
DRAIN PENROSE 1/4X12 LTX STRL (WOUND CARE) IMPLANT
DRSG ADAPTIC 3X8 NADH LF (GAUZE/BANDAGES/DRESSINGS) IMPLANT
DRSG EMULSION OIL 3X3 NADH (GAUZE/BANDAGES/DRESSINGS) ×3 IMPLANT
DRSG PAD ABDOMINAL 8X10 ST (GAUZE/BANDAGES/DRESSINGS) ×6 IMPLANT
GAUZE PACKING IODOFORM 1/4X15 (GAUZE/BANDAGES/DRESSINGS) ×2 IMPLANT
GAUZE SPONGE 4X4 12PLY STRL (GAUZE/BANDAGES/DRESSINGS) ×3 IMPLANT
GAUZE XEROFORM 1X8 LF (GAUZE/BANDAGES/DRESSINGS) ×3 IMPLANT
GLOVE BIO SURGEON STRL SZ7.5 (GLOVE) ×3 IMPLANT
GLOVE BIOGEL PI IND STRL 8 (GLOVE) ×1 IMPLANT
GLOVE BIOGEL PI INDICATOR 8 (GLOVE) ×2
GOWN STRL REUS W/ TWL LRG LVL3 (GOWN DISPOSABLE) ×1 IMPLANT
GOWN STRL REUS W/TWL LRG LVL3 (GOWN DISPOSABLE) ×3
KIT BASIN OR (CUSTOM PROCEDURE TRAY) ×3 IMPLANT
KIT ROOM TURNOVER OR (KITS) ×3 IMPLANT
LOOP VESSEL MAXI BLUE (MISCELLANEOUS) IMPLANT
MANIFOLD NEPTUNE II (INSTRUMENTS) ×1 IMPLANT
NDL HYPO 25X1 1.5 SAFETY (NEEDLE) IMPLANT
NEEDLE HYPO 25X1 1.5 SAFETY (NEEDLE) IMPLANT
NS IRRIG 1000ML POUR BTL (IV SOLUTION) ×3 IMPLANT
PACK ORTHO EXTREMITY (CUSTOM PROCEDURE TRAY) ×3 IMPLANT
PAD ARMBOARD 7.5X6 YLW CONV (MISCELLANEOUS) ×6 IMPLANT
SCRUB BETADINE 4OZ XXX (MISCELLANEOUS) ×1 IMPLANT
SET CYSTO W/LG BORE CLAMP LF (SET/KITS/TRAYS/PACK) ×1 IMPLANT
SOLUTION BETADINE 4OZ (MISCELLANEOUS) ×1 IMPLANT
SPLINT FINGER (SOFTGOODS) ×2 IMPLANT
SPONGE LAP 4X18 X RAY DECT (DISPOSABLE) ×1 IMPLANT
SUT ETHILON 4 0 P 3 18 (SUTURE) IMPLANT
SUT ETHILON 4 0 PS 2 18 (SUTURE) ×3 IMPLANT
SUT MON AB 5-0 P3 18 (SUTURE) IMPLANT
SYR CONTROL 10ML LL (SYRINGE) ×2 IMPLANT
TOWEL OR 17X24 6PK STRL BLUE (TOWEL DISPOSABLE) ×3 IMPLANT
TOWEL OR 17X26 10 PK STRL BLUE (TOWEL DISPOSABLE) ×3 IMPLANT
TUBE ANAEROBIC SPECIMEN COL (MISCELLANEOUS) ×2 IMPLANT
TUBE CONNECTING 12'X1/4 (SUCTIONS) ×1
TUBE CONNECTING 12X1/4 (SUCTIONS) ×2 IMPLANT
TUBE FEEDING 5FR 15 INCH (TUBING) IMPLANT
UNDERPAD 30X30 (UNDERPADS AND DIAPERS) ×3 IMPLANT
WATER STERILE IRR 1000ML POUR (IV SOLUTION) ×1 IMPLANT
YANKAUER SUCT BULB TIP NO VENT (SUCTIONS) ×3 IMPLANT

## 2017-02-05 NOTE — Anesthesia Preprocedure Evaluation (Addendum)
Anesthesia Evaluation  Patient identified by MRN, date of birth, ID band Patient awake    Reviewed: Allergy & Precautions  Airway Mallampati: II  TM Distance: >3 FB     Dental   Pulmonary Current Smoker,    breath sounds clear to auscultation       Cardiovascular negative cardio ROS   Rhythm:Regular Rate:Normal     Neuro/Psych    GI/Hepatic negative GI ROS, Neg liver ROS,   Endo/Other  negative endocrine ROS  Renal/GU negative Renal ROS     Musculoskeletal   Abdominal   Peds  Hematology   Anesthesia Other Findings   Reproductive/Obstetrics                             Anesthesia Physical Anesthesia Plan  ASA: II  Anesthesia Plan: Regional and MAC   Post-op Pain Management:    Induction: Intravenous  Airway Management Planned:   Additional Equipment:   Intra-op Plan:   Post-operative Plan:   Informed Consent: I have reviewed the patients History and Physical, chart, labs and discussed the procedure including the risks, benefits and alternatives for the proposed anesthesia with the patient or authorized representative who has indicated his/her understanding and acceptance.   Dental advisory given  Plan Discussed with: CRNA and Anesthesiologist  Anesthesia Plan Comments:         Anesthesia Quick Evaluation

## 2017-02-05 NOTE — ED Triage Notes (Signed)
Pt here for left index finger pain and swelling from wound to left hand

## 2017-02-05 NOTE — ED Notes (Signed)
All belongings and valuables given to pts son.

## 2017-02-05 NOTE — Brief Op Note (Signed)
02/05/2017  7:06 PM  PATIENT:  Cherlyn Robertsarrell R Balfour  41 y.o. male  PRE-OPERATIVE DIAGNOSIS:  infection  POST-OPERATIVE DIAGNOSIS:  Left index finger abscess  PROCEDURE:  Procedure(s): IRRIGATION AND DEBRIDEMENT INDEX FINGER (Left)  SURGEON:  Surgeon(s) and Role:    * Betha LoaKevin Taiana Temkin, MD - Primary  PHYSICIAN ASSISTANT:   ASSISTANTS: none   ANESTHESIA:   general  EBL:  Total I/O In: 900 [I.V.:900] Out: -   BLOOD ADMINISTERED:none  DRAINS: iodoform packing  LOCAL MEDICATIONS USED:  MARCAINE     SPECIMEN:  Source of Specimen:  left index finger  DISPOSITION OF SPECIMEN:  micro  COUNTS:  YES  TOURNIQUET:  * No tourniquets in log *  DICTATION: .Other Dictation: Dictation Number [id number]  PLAN OF CARE: Discharge to home after PACU  PATIENT DISPOSITION:  PACU - hemodynamically stable.

## 2017-02-05 NOTE — Transfer of Care (Signed)
Immediate Anesthesia Transfer of Care Note  Patient: Jesus Rivera  Procedure(s) Performed: Procedure(s): IRRIGATION AND DEBRIDEMENT INDEX FINGER (Left)  Patient Location: PACU  Anesthesia Type:General  Level of Consciousness: awake, alert  and oriented  Airway & Oxygen Therapy: Patient Spontanous Breathing  Post-op Assessment: Report given to RN and Post -op Vital signs reviewed and stable  Post vital signs: Reviewed and stable  Last Vitals:  Vitals:   02/05/17 1114 02/05/17 1435  BP: 156/99 136/84  Pulse: 92 90  Resp: 18 16  Temp: 36.7 C 36.7 C    Last Pain:  Vitals:   02/05/17 1658  TempSrc:   PainSc: 3          Complications: No apparent anesthesia complications

## 2017-02-05 NOTE — H&P (Signed)
Jesus Rivera is an 41 y.o. male.   Chief Complaint: left index finger infection HPI: 41 yo lhd male states he has been having issues with left index finger over past two days.  Noted a white spot that he popped.  Has had progressively worsening swelling/erythema/pain.  Seen at [facility] this AM.  No fevers, chills, night sweats.  Throbbing pain in finger.  Worsened with dependent positioning and palpation, relieved with elevation.  Doristine Devoid, PA-C 's note from 02/05/2017 reviewed. Xrays viewed and interpreted by me: 3 views left hand show no fracture, dislocation, radioopaque foreign body. Labs reviewed: WBC 8.9  Allergies: No Known Allergies  Past Medical History:  Diagnosis Date  . ADHD   . Anxiety   . Back pain     History reviewed. No pertinent surgical history.  Family History: Family History  Problem Relation Age of Onset  . Hypertension Mother   . Deep vein thrombosis Father     Social History:   reports that he has been smoking Cigarettes.  He has never used smokeless tobacco. He reports that he does not drink alcohol or use drugs.  Medications:  (Not in a hospital admission)  Results for orders placed or performed during the hospital encounter of 02/05/17 (from the past 48 hour(s))  CBC with Differential     Status: None   Collection Time: 02/05/17  1:58 PM  Result Value Ref Range   WBC 8.9 4.0 - 10.5 K/uL   RBC 4.70 4.22 - 5.81 MIL/uL   Hemoglobin 14.8 13.0 - 17.0 g/dL   HCT 44.7 39.0 - 52.0 %   MCV 95.1 78.0 - 100.0 fL   MCH 31.5 26.0 - 34.0 pg   MCHC 33.1 30.0 - 36.0 g/dL   RDW 12.7 11.5 - 15.5 %   Platelets 244 150 - 400 K/uL   Neutrophils Relative % 49 %   Neutro Abs 4.3 1.7 - 7.7 K/uL   Lymphocytes Relative 38 %   Lymphs Abs 3.4 0.7 - 4.0 K/uL   Monocytes Relative 8 %   Monocytes Absolute 0.7 0.1 - 1.0 K/uL   Eosinophils Relative 5 %   Eosinophils Absolute 0.4 0.0 - 0.7 K/uL   Basophils Relative 0 %   Basophils Absolute 0.0 0.0 - 0.1 K/uL   Basic metabolic panel     Status: None   Collection Time: 02/05/17  1:58 PM  Result Value Ref Range   Sodium 138 135 - 145 mmol/L   Potassium 3.9 3.5 - 5.1 mmol/L   Chloride 103 101 - 111 mmol/L   CO2 28 22 - 32 mmol/L   Glucose, Bld 89 65 - 99 mg/dL   BUN 6 6 - 20 mg/dL   Creatinine, Ser 0.69 0.61 - 1.24 mg/dL   Calcium 9.2 8.9 - 10.3 mg/dL   GFR calc non Af Amer >60 >60 mL/min   GFR calc Af Amer >60 >60 mL/min    Comment: (NOTE) The eGFR has been calculated using the CKD EPI equation. This calculation has not been validated in all clinical situations. eGFR's persistently <60 mL/min signify possible Chronic Kidney Disease.    Anion gap 7 5 - 15    Dg Hand Complete Left  Result Date: 02/05/2017 CLINICAL DATA:  Second finger swelling and pain starting yesterday, possible abscess EXAM: LEFT HAND - COMPLETE 3+ VIEW COMPARISON:  None. FINDINGS: Three views of the left hand submitted. There is soft tissue swelling proximal left second finger. No periosteal reaction or bony  erosion. No radiopaque foreign body. No acute fracture or subluxation. IMPRESSION: No acute fracture or subluxation. No evidence of osteomyelitis. Soft tissue swelling proximal and mid left second finger. Electronically Signed   By: Lahoma Crocker M.D.   On: 02/05/2017 13:30     A comprehensive review of systems was negative. Review of Systems: No fevers, chills, night sweats, chest pain, shortness of breath, nausea, vomiting, diarrhea, constipation, easy bleeding or bruising, headaches, dizziness, vision changes, fainting.   Blood pressure 136/84, pulse 90, temperature 98 F (36.7 C), temperature source Oral, resp. rate 16, SpO2 100 %.  General appearance: alert, cooperative and appears stated age Head: Normocephalic, without obvious abnormality, atraumatic Neck: supple, symmetrical, trachea midline Resp: clear to auscultation bilaterally Cardio: regular rate and rhythm GI: non-tender Extremities: Intact sensation  and capillary refill all digits.  +epl/fpl/io.  Small wound dorsum left index proximal phalanx.  Erythema surrounding and into distal hand.  Tender to palpation dorsum index finger.  Small amount of yellow drainage.  No proximal streaking.  Able to move digits some, but swollen.  No volar tenderness.  Some fluctuance palpable dorsally. Pulses: 2+ and symmetric Skin: Skin color, texture, turgor normal. No rashes or lesions Neurologic: Grossly normal Incision/Wound: As above  Assessment/Plan Left index finger abscess.  Non operative and operative treatment options were discussed with the patient and patient wishes to proceed with operative drainage. Risks, benefits, and alternatives of surgery were discussed and the patient agrees with the plan of care.   Shawneequa Baldridge R 02/05/2017, 5:13 PM

## 2017-02-05 NOTE — Op Note (Signed)
NAMBeckie Rivera:  Jesus Rivera, Jesus Rivera              ACCOUNT NO.:  [id number]  MEDICAL RECORD NO.:  [id number]  LOCATION:                                 FACILITY:  PHYSICIAN:  Betha LoaKevin Justinian Miano, MD             DATE OF BIRTH:  DATE OF PROCEDURE:  02/05/2017 DATE OF DISCHARGE:                              OPERATIVE REPORT   PREOPERATIVE DIAGNOSIS:  Left index finger abscess.  POSTOPERATIVE DIAGNOSIS:  Left index finger abscess.  PROCEDURE:  Incision and drainage, left index finger subcutaneous abscess.  SURGEON:  Betha LoaKevin Meara Wiechman, MD.  ASSISTANT:  None.  ANESTHESIA:  General.  IV FLUIDS:  Per anesthesia flow sheet.  ESTIMATED BLOOD LOSS:  Minimal.  COMPLICATIONS:  None.  SPECIMENS:  Cultures to Micro.  TOURNIQUET TIME:  Approximately 15 minutes.  DISPOSITION:  Stable to PACU.  INDICATIONS:  Jesus Rivera is a 41 year old male who has had problems with left index finger over the past couple days.  It has become increasingly swollen, painful, and erythematous.  He had a whitehead on it popped, but has slowly gotten worse.  No fevers, chills, or night sweats.  I recommended incision and drainage.  Risks, benefits, and alternatives of surgery were discussed including risk of blood loss; infection; damage to nerves, vessels, tendons, ligaments, bone; failure of surgery; need for additional surgery; complications with wound healing; continued pain; continued infection; need for repeat irrigation and debridement. He voiced understanding of these risks and elected to proceed.  DESCRIPTION OF PROCEDURE:  After being identified preoperatively by myself, the patient and I agreed upon procedure and site of procedure. Surgical site was marked.  The risks, benefits, and alternatives of surgery were reviewed and he wished to proceed.  Surgical consent had been signed.  Antibiotics were held for cultures.  He was transferred to the operating room and placed on the operating room table in supine position with  left upper extremity on armboard.  General anesthesia induced by Anesthesiology.  Left upper extremity was prepped and draped in normal sterile orthopedic fashion.  Surgical pause was performed between surgeons, anesthesia, and operating staff, and all were in agreement as to the patient procedure and site of procedure.  Tourniquet at the proximal aspect of the extremity was inflated to 250 mmHg after exsanguination of the limb with an Esmarch bandage.  An incision was made on the dorsum of the finger including the small sinus tracts that had some purulent material within it.  There was gross purulence underneath the skin in the subcutaneous tissues.  Cultures were taken for aerobes and anaerobes.  The purulent material was removed with the pickups.  The knife was used to sharply debride the skin and subcutaneous tissues of any purulent or devitalized tissues.  The infection did not go deep to the extensor tendon.  The wound was copiously irrigated with sterile saline.  It was then packed with quarter-inch iodoform gauze.  It was dressed with sterile 4x4s and wrapped with a Coban dressing lightly.  Alumafoam splint was placed and wrapped lightly with a Coban dressing.  Approximately 7 mL of 0.25% plain Marcaine was used in the operative area to aid in  postoperative analgesia.  The tourniquet was deflated approximately 15 minutes. Fingertips were pink with brisk capillary refill after deflation of tourniquet.  The operative drapes were broken down and the patient was awoken from anesthesia safely.  He was transferred back to stretcher and taken to PACU in stable condition.  He was given IV Ancef after the cultures have been taken.  We will give him oxycodone 5 mg 1-2 p.o. q.6 hours p.r.n. pain, dispensed #20 and also give him Bactrim DS 1 p.o. b.i.d. x7 days.  We will see him back in the office on Monday for postoperative followup.     Betha Loa, MD     KK/MEDQ  D:  02/05/2017   T:  02/05/2017  Job:  [id number]

## 2017-02-05 NOTE — Anesthesia Procedure Notes (Signed)
Procedure Name: Intubation Date/Time: 02/05/2017 6:35 PM Performed by: Reine JustFLOWERS, Esbeidy Mclaine T Pre-anesthesia Checklist: Patient identified, Emergency Drugs available, Suction available, Patient being monitored and Timeout performed Patient Re-evaluated:Patient Re-evaluated prior to inductionOxygen Delivery Method: Circle system utilized and Simple face mask Preoxygenation: Pre-oxygenation with 100% oxygen Intubation Type: IV induction Ventilation: Mask ventilation without difficulty Laryngoscope Size: Miller and 3 Grade View: Grade I Tube type: Oral Tube size: 7.5 mm Number of attempts: 1 Airway Equipment and Method: Patient positioned with wedge pillow and Stylet Placement Confirmation: ETT inserted through vocal cords under direct vision,  positive ETCO2 and breath sounds checked- equal and bilateral Secured at: 23 cm Tube secured with: Tape Dental Injury: Teeth and Oropharynx as per pre-operative assessment

## 2017-02-05 NOTE — ED Provider Notes (Signed)
[facility]-EMERGENCY DEPT Provider Note   CSN: [id number] Arrival date & time: 02/05/17  1028  By signing my name below, I, Marnette Burgessyan Andrew Long, attest that this documentation has been prepared under the direction and in the presence of Nationwide Mutual InsuranceKenneth Tyler Micaila Ziemba, PA-C. Electronically Signed: Marnette Burgessyan Andrew Long, Scribe. 02/05/2017. 12:20 PM.  History   Chief Complaint Chief Complaint  Patient presents with  . Abscess   The history is provided by the patient. No language interpreter was used.    HPI Comments: Jesus Rivera is a 41 y.o. male with a PMHx of ADHD, who presents to the Emergency Department complaining of a moderate, gradually worsening area of 8/10 pain and swelling to the left index finger onset yesterday. He states it started as a "white dot" two nights ago. He tried Peroxide on the finger at that time with no relief of the pain. He did not notice any bite or state an acute injury to the finger but reports a h/o a past spider bite on his leg that he was hospitalized for (MRSA). He has associated symptoms of redness to the area with clear drainage. Pt states pain is exacerbated with palpation, direct pressure, and flexion/ extension. He denies pain to extend/ flex wrist and elbow. Pt is left hand dominant. Denies fever, nausea, vomiting, and any other complaints at this time. Pt is a current every day smoker.   Past Medical History:  Diagnosis Date  . ADHD   . Anxiety   . Back pain     Patient Active Problem List   Diagnosis Date Noted  . Elevated LFTs 02/12/2014  . Cellulitis and abscess of leg 10/20/2013  . ADD (attention deficit disorder) 10/20/2013    History reviewed. No pertinent surgical history.   Home Medications    Prior to Admission medications   Medication Sig Start Date End Date Taking? Authorizing Provider  ALPRAZolam Prudy Feeler(XANAX) 1 MG tablet Take 1 mg by mouth 4 (four) times daily as needed for anxiety.    Historical Provider, MD  amphetamine-dextroamphetamine  (ADDERALL) 20 MG tablet Take 20 mg by mouth daily.    Historical Provider, MD  clindamycin (CLEOCIN) 300 MG capsule Take 2 capsules (600 mg total) by mouth 3 (three) times daily. 02/12/14   Shanker Levora DredgeM Ghimire, MD  oxyCODONE (ROXICODONE) 5 MG immediate release tablet Take 1 tablet (5 mg total) by mouth every 6 (six) hours as needed for moderate pain. 02/12/14   Shanker Levora DredgeM Ghimire, MD    Family History Family History  Problem Relation Age of Onset  . Hypertension Mother   . Deep vein thrombosis Father     Social History Social History  Substance Use Topics  . Smoking status: Current Every Day Smoker    Types: Cigarettes  . Smokeless tobacco: Never Used  . Alcohol use No     Allergies   Patient has no known allergies.   Review of Systems Review of Systems  Constitutional: Negative for fever.  Gastrointestinal: Negative for nausea and vomiting.  Musculoskeletal: Positive for myalgias.  Skin: Positive for color change and wound.       Positive clear drainage  All other systems reviewed and are negative.    Physical Exam Updated Vital Signs BP 136/84 (BP Location: Right Arm)   Pulse 90   Temp 98 F (36.7 C) (Oral)   Resp 16   SpO2 100%   Physical Exam  Constitutional: He is oriented to person, place, and time. He appears well-developed and well-nourished. No distress.  Nontoxic-appearing.  HENT:  Head: Normocephalic.  Eyes: Conjunctivae are normal.  Cardiovascular: Normal rate.   Pulmonary/Chest: Effort normal.  Abdominal: He exhibits no distension.  Musculoskeletal: Normal range of motion.  Wound noted to the dorsum side of the index finger of the left hand. Erythema noted. Edema of the entire left hand noted. Radial pulses are 2+ bilaterally. Patient holding his fingers in a flexed position. He does have pain with passive extension of the finger. There is no erythema over the flexor tendon. No pain with palpation of the flexor tendon. No area of fluctuance. Cap refill  normal. Decreased strength due to swelling and pain. Full range of motion of the left wrist.  Neurological: He is alert and oriented to person, place, and time.  Skin: Skin is warm and dry.  Psychiatric: He has a normal mood and affect.  Nursing note and vitals reviewed.        ED Treatments / Results  DIAGNOSTIC STUDIES:  Oxygen Saturation is 100% on RA, normal by my interpretation.    COORDINATION OF CARE:  12:19 PM Discussed treatment plan with pt at bedside including consult to Dr. Adela Lank and Dr. Merlyn Lot with potential [place] of Left Finger and pt agreed to plan.  Labs (all labs ordered are listed, but only abnormal results are displayed) Labs Reviewed  CBC WITH DIFFERENTIAL/PLATELET  BASIC METABOLIC PANEL    EKG  EKG Interpretation None       Radiology Dg Hand Complete Left  Result Date: 02/05/2017 CLINICAL DATA:  Second finger swelling and pain starting yesterday, possible abscess EXAM: LEFT HAND - COMPLETE 3+ VIEW COMPARISON:  None. FINDINGS: Three views of the left hand submitted. There is soft tissue swelling proximal left second finger. No periosteal reaction or bony erosion. No radiopaque foreign body. No acute fracture or subluxation. IMPRESSION: No acute fracture or subluxation. No evidence of osteomyelitis. Soft tissue swelling proximal and mid left second finger. Electronically Signed   By: Natasha Mead M.D.   On: 02/05/2017 13:30    Procedures Procedures (including critical care time)  Medications Ordered in ED Medications  HYDROcodone-acetaminophen (NORCO/VICODIN) 5-325 MG per tablet 1 tablet (1 tablet Oral Given 02/05/17 1256)  morphine 4 MG/ML injection 4 mg (4 mg Intravenous Given 02/05/17 1617)     Initial Impression / Assessment and Plan / ED Course  I have reviewed the triage vital signs and the nursing notes.  Pertinent labs & imaging results that were available during my care of the patient were reviewed by me and considered in my medical decision  making (see chart for details).     Patient presents to the ED with left hand erythema, pain, swelling onset 2 days ago. History of MRSA with IV antibiotics in the hospital in the past. Patient is afebrile. X-ray shows soft tissue swelling without any bony involvement. Patient holding his finger in a flexed position and does have pain with passive extension. Low suspicion for tenosynovitis given that patient does not have any pain over the flexor tendon sheath. Doubt septic joint. Neurovascularly intact. Spoke with Dr. Merlyn Lot who will come to evaluate patient in the ED for possible drainage. Recommended x-ray, labs, IV. Patient was given pain medicine. He is nontoxic-appearing. He is hemodynamically stable in no acute distress. He is afebrile. Pt updated on plan of care. Care hand off to [facility] who will dispose based off Dr. Merlyn Lot recommendations.  Final Clinical Impressions(s) / ED Diagnoses   Final diagnoses:  None    New  Prescriptions New Prescriptions   No medications on file   I personally performed the services described in this documentation, which was scribed in my presence. The recorded information has been reviewed and is accurate.     Rise Mu, PA-C 02/05/17 1638    Melene Plan, DO 02/05/17 1610

## 2017-02-05 NOTE — Discharge Instructions (Signed)

## 2017-02-06 ENCOUNTER — Encounter (HOSPITAL_COMMUNITY): Payer: Self-pay | Admitting: Orthopedic Surgery

## 2017-02-06 NOTE — Anesthesia Postprocedure Evaluation (Addendum)
Anesthesia Post Note  Patient: Jesus RobertsDarrell R Rivera  Procedure(s) Performed: Procedure(s) (LRB): IRRIGATION AND DEBRIDEMENT INDEX FINGER (Left)  Patient location during evaluation: PACU Anesthesia Type: Regional Level of consciousness: awake, awake and alert and oriented Pain management: pain level controlled Vital Signs Assessment: post-procedure vital signs reviewed and stable Respiratory status: spontaneous breathing, nonlabored ventilation and respiratory function stable Cardiovascular status: blood pressure returned to baseline Anesthetic complications: no       Last Vitals:  Vitals:   02/05/17 1945 02/05/17 2000  BP: 133/90 132/84  Pulse: 80 80  Resp: 20   Temp:  36.6 C    Last Pain:  Vitals:   02/05/17 2000  TempSrc:   PainSc: 4                  Beautifull Cisar COKER

## 2017-02-08 LAB — AEROBIC CULTURE  (SUPERFICIAL SPECIMEN)

## 2017-02-08 LAB — AEROBIC CULTURE W GRAM STAIN (SUPERFICIAL SPECIMEN)

## 2017-02-10 LAB — ANAEROBIC CULTURE

## 2017-07-22 ENCOUNTER — Encounter (HOSPITAL_COMMUNITY): Payer: Self-pay | Admitting: Orthopedic Surgery

## 2017-07-22 NOTE — Addendum Note (Signed)
Addendum  created 07/22/17 1132 by Kipp Brood, MD   Sign clinical note

## 2017-10-15 ENCOUNTER — Encounter (HOSPITAL_COMMUNITY): Payer: Self-pay | Admitting: *Deleted

## 2017-10-15 ENCOUNTER — Emergency Department (HOSPITAL_COMMUNITY): Payer: Self-pay

## 2017-10-15 ENCOUNTER — Emergency Department (HOSPITAL_COMMUNITY)
Admission: EM | Admit: 2017-10-15 | Discharge: 2017-10-16 | Disposition: A | Discharge status: Home / Self Care | Payer: Self-pay | Attending: Emergency Medicine | Admitting: Emergency Medicine

## 2017-10-15 ENCOUNTER — Other Ambulatory Visit: Payer: Self-pay

## 2017-10-15 DIAGNOSIS — Z79899 Other long term (current) drug therapy: Secondary | ICD-10-CM | POA: Insufficient documentation

## 2017-10-15 DIAGNOSIS — L03115 Cellulitis of right lower limb: Secondary | ICD-10-CM | POA: Insufficient documentation

## 2017-10-15 DIAGNOSIS — R609 Edema, unspecified: Secondary | ICD-10-CM | POA: Insufficient documentation

## 2017-10-15 DIAGNOSIS — F17210 Nicotine dependence, cigarettes, uncomplicated: Secondary | ICD-10-CM | POA: Insufficient documentation

## 2017-10-15 LAB — CBC WITH DIFFERENTIAL/PLATELET
BASOS ABS: 0.1 10*3/uL (ref 0.0–0.1)
Basophils Relative: 1 %
EOS ABS: 0.4 10*3/uL (ref 0.0–0.7)
EOS PCT: 6 %
HCT: 43.4 % (ref 39.0–52.0)
Hemoglobin: 14 g/dL (ref 13.0–17.0)
Lymphocytes Relative: 46 %
Lymphs Abs: 3.3 10*3/uL (ref 0.7–4.0)
MCH: 30.8 pg (ref 26.0–34.0)
MCHC: 32.3 g/dL (ref 30.0–36.0)
MCV: 95.6 fL (ref 78.0–100.0)
MONO ABS: 0.6 10*3/uL (ref 0.1–1.0)
Monocytes Relative: 9 %
Neutro Abs: 2.6 10*3/uL (ref 1.7–7.7)
Neutrophils Relative %: 38 %
PLATELETS: 233 10*3/uL (ref 150–400)
RBC: 4.54 MIL/uL (ref 4.22–5.81)
RDW: 13.2 % (ref 11.5–15.5)
WBC: 7 10*3/uL (ref 4.0–10.5)

## 2017-10-15 LAB — COMPREHENSIVE METABOLIC PANEL
ALT: 45 U/L (ref 17–63)
AST: 39 U/L (ref 15–41)
Albumin: 3.7 g/dL (ref 3.5–5.0)
Alkaline Phosphatase: 99 U/L (ref 38–126)
Anion gap: 10 (ref 5–15)
BUN: 7 mg/dL (ref 6–20)
CO2: 23 mmol/L (ref 22–32)
Calcium: 8.8 mg/dL — ABNORMAL LOW (ref 8.9–10.3)
Chloride: 101 mmol/L (ref 101–111)
Creatinine, Ser: 0.86 mg/dL (ref 0.61–1.24)
GFR calc Af Amer: >60 mL/min (ref >60)
GFR calc non Af Amer: >60 mL/min (ref >60)
Glucose, Bld: 118 mg/dL — ABNORMAL HIGH (ref 65–99)
POTASSIUM: 4 mmol/L (ref 3.5–5.1)
Sodium: 134 mmol/L — ABNORMAL LOW (ref 135–145)
TOTAL PROTEIN: 6.6 g/dL (ref 6.5–8.1)
Total Bilirubin: 0.5 mg/dL (ref 0.3–1.2)

## 2017-10-15 LAB — BRAIN NATRIURETIC PEPTIDE: B NATRIURETIC PEPTIDE 5: 4 pg/mL (ref 0.0–100.0)

## 2017-10-15 LAB — I-STAT TROPONIN, ED: Troponin i, poc: 0 ng/mL (ref 0.00–0.08)

## 2017-10-15 LAB — I-STAT CG4 LACTIC ACID, ED: Lactic Acid, Venous: 1.38 mmol/L (ref 0.5–1.9)

## 2017-10-15 NOTE — ED Triage Notes (Signed)
Pt c/o bil leg edema with 2 cm x 2 cm wound to R lower leg & 3 cm wound to L lower leg,  Onset x6 days, denies SOB, A&O x4

## 2017-10-16 ENCOUNTER — Encounter (HOSPITAL_COMMUNITY): Payer: Self-pay

## 2017-10-16 MED ORDER — DOXYCYCLINE HYCLATE 100 MG PO CAPS
100.0000 mg | ORAL_CAPSULE | Freq: Two times a day (BID) | ORAL | 0 refills | Status: AC
Start: 2017-10-16 — End: ?

## 2017-10-16 MED ORDER — FUROSEMIDE 20 MG PO TABS: 20.0000 mg | ORAL_TABLET | Freq: Every day | ORAL | 0 refills | Status: AC

## 2017-10-16 NOTE — ED Provider Notes (Signed)
[facility] Provider Note   CSN: [id number] Arrival date & time: 10/15/17  1356     History   Chief Complaint Chief Complaint  Patient presents with  . Leg Swelling    HPI Jesus Rivera is a 41 y.o. male with a hx of ADHD, anxiety presents to the Emergency Department complaining of gradual, persistent, progressively worsening BLE edema onset 5 days ago.  Pt ports he has had intermittent swelling of the bilateral lower extremities over the last several years.  He reports several times he has been admitted for this.  The last time was for a significant cellulitis of the left leg but this was approximately 3 years ago.  Patient denies history of heart failure, diabetes, increased urination or thirst, no chest pain or shortness of breath, no dominant pain, nausea or vomiting.  He reports that being on his feet for long periods of time seems to worsen the swelling.  He reports that often the swelling decreases when he elevates his legs however he has had them elevated for the last 2 days without improvement.  The patient reports noticing the pain and wound to the right lower leg several days ago.  He denies known injury, falls.  He denies salt water exposure.  No treatments prior to arrival.  Nothing seems to make his symptoms better or worse at this time.  Patient does report several bouts of watery diarrhea approximately 2 weeks ago which have resolved and have not returned.  No international travel, melena, hematochezia, known sick contacts.   The history is provided by the patient and medical records. No language interpreter was used.    Past Medical History:  Diagnosis Date  . ADHD   . Anxiety   . Back pain     Patient Active Problem List   Diagnosis Date Noted  . Elevated LFTs 02/12/2014  . Cellulitis and abscess of leg 10/20/2013  . ADD (attention deficit disorder) 10/20/2013    Past Surgical History:  Procedure Laterality Date  .  IRRIGATION AND DEBRIDEMENT INDEX FINGER Left 02/05/2017   Performed by Betha LoaKuzma, Kevin, MD at [facility]       Home Medications    Prior to Admission medications   Medication Sig Start Date End Date Taking? Authorizing Provider  ALPRAZolam Prudy Feeler(XANAX) 1 MG tablet Take 1 mg by mouth 4 (four) times daily as needed for anxiety.   Yes [provider]  amphetamine-dextroamphetamine (ADDERALL) 20 MG tablet Take 20 mg by mouth daily.   Yes [provider]  methadone (METHADOSE) 40 MG disintegrating tablet Take 80 mg daily by mouth.   Yes [provider]  doxycycline (VIBRAMYCIN) 100 MG capsule Take 1 capsule (100 mg total) 2 (two) times daily by mouth. 10/16/17   Jermaine Tholl, Dahlia ClientHannah, PA-C  furosemide (LASIX) 20 MG tablet Take 1 tablet (20 mg total) daily by mouth. 10/16/17   Chael Urenda, Dahlia ClientHannah, PA-C    Family History Family History  Problem Relation Age of Onset  . Hypertension Mother   . Deep vein thrombosis Father     Social History Social History   Tobacco Use  . Smoking status: Current Every Day Smoker    Packs/day: 0.50    Types: Cigarettes  . Smokeless tobacco: Never Used  Substance Use Topics  . Alcohol use: No  . Drug use: No     Allergies   Patient has no known allergies.   Review of Systems Review of Systems  Constitutional: Negative for  appetite change, diaphoresis, fatigue, fever and unexpected weight change.  HENT: Negative for mouth sores.   Eyes: Negative for visual disturbance.  Respiratory: Negative for cough, chest tightness, shortness of breath and wheezing.   Cardiovascular: Positive for leg swelling. Negative for chest pain.  Gastrointestinal: Negative for abdominal pain, constipation, diarrhea, nausea and vomiting.  Endocrine: Negative for polydipsia, polyphagia and polyuria.  Genitourinary: Negative for dysuria, frequency, hematuria and urgency.  Musculoskeletal: Positive for myalgias. Negative for back pain and neck stiffness.    Skin: Positive for color change, rash and wound.  Allergic/Immunologic: Negative for immunocompromised state.  Neurological: Negative for syncope, light-headedness and headaches.  Hematological: Does not bruise/bleed easily.  Psychiatric/Behavioral: Negative for sleep disturbance. The patient is not nervous/anxious.      Physical Exam Updated Vital Signs BP 115/74 (BP Location: Right Arm)   Pulse (!) 101   Temp 98.1 F (36.7 C) (Oral)   Resp 18   Ht 6\' 3"  (1.905 m)   Wt 127 kg (280 lb)   SpO2 97%   BMI 35.00 kg/m   Physical Exam  Constitutional: He appears well-developed and well-nourished. No distress.  Awake, alert, nontoxic appearance  HENT:  Head: Normocephalic and atraumatic.  Mouth/Throat: Oropharynx is clear and moist. No oropharyngeal exudate.  Eyes: Conjunctivae are normal. No scleral icterus.  Neck: Normal range of motion. Neck supple.  Cardiovascular: Normal rate, regular rhythm and intact distal pulses.  Pulmonary/Chest: Effort normal and breath sounds normal. No respiratory distress. He has no wheezes.  Equal chest expansion  Abdominal: Soft. Bowel sounds are normal. He exhibits no mass. There is no tenderness. There is no rebound and no guarding.  Musculoskeletal: Normal range of motion. He exhibits edema ( 2+ pitting edema from the proximal forefoot to the proximal tibia).  Neurological: He is alert.  Speech is clear and goal oriented Moves extremities without ataxia  Skin: Skin is warm and dry. Rash noted. He is not diaphoretic. There is erythema.  Vesicular rash noted along the left ankle, circumferential Small area of cellulitis without fluctuance or drainage to the right distal lower leg with increased warmth  Psychiatric: He has a normal mood and affect.  Nursing note and vitals reviewed.          ED Treatments / Results  Labs (all labs ordered are listed, but only abnormal results are displayed) Labs Reviewed  COMPREHENSIVE METABOLIC PANEL  - Abnormal; Notable for the following components:      Result Value   Sodium 134 (*)    Glucose, Bld 118 (*)    Calcium 8.8 (*)    All other components within normal limits  CBC WITH DIFFERENTIAL/PLATELET  BRAIN NATRIURETIC PEPTIDE  URINALYSIS, ROUTINE W REFLEX MICROSCOPIC  I-STAT TROPONIN, ED  I-STAT CG4 LACTIC ACID, ED  I-STAT CG4 LACTIC ACID, ED    EKG  EKG Interpretation  Date/Time:  Saturday October 16 2017 00:08:28 EST Ventricular Rate:  89 PR Interval:  150 QRS Duration: 98 QT Interval:  382 QTC Calculation: 464 R Axis:   7 Text Interpretation:  Normal sinus rhythm Moderate voltage criteria for LVH, may be normal variant Borderline ECG No prior for comparison Confirmed by Ross Marcus ([id number]) on 10/16/2017 12:15:07 AM       Radiology Dg Chest 2 View  Result Date: 10/15/2017 CLINICAL DATA:  41 year old male with bilateral lower extremity edema. EXAM: CHEST  2 VIEW COMPARISON:  None. FINDINGS: The lungs are clear. There is no pleural effusion pneumothorax. The cardiac silhouette is  within normal limits. No acute osseous pathology. IMPRESSION: No active cardiopulmonary disease. Electronically Signed   By: Elgie CollardArash  Radparvar M.D.   On: 10/15/2017 22:16    Procedures Procedures (including critical care time)  Medications Ordered in ED Medications - No data to display   Initial Impression / Assessment and Plan / ED Course  I have reviewed the triage vital signs and the nursing notes.  Pertinent labs & imaging results that were available during my care of the patient were reviewed by me and considered in my medical decision making (see chart for details).     Patient presents with leg swelling for the last 5 days now with signs of cellulitis in the right leg regular rash on the left.  He is without chest pain or shortness of breath.  Labs are reassuring.  No evidence of sepsis.  No antibiotics prior to arrival today.  Workup here is without evidence of heart  failure or sepsis.  Patient reports previous admissions for similar symptoms.  He does not of the etiology of his peripheral edema.  He does not regularly follow with primary care.  [facility].  Venous duplex scheduled for the a.m. however less likely to be DVT as patient has bilateral lower extremity swelling.  He has never had a DVT.  No long periods of immobilization aside from the last 2 days.  No shortness of breath or chest pain.  The patient was discussed with and seen by Dr. Rush Landmarkegeler who agrees with the treatment plan.   Final Clinical Impressions(s) / ED Diagnoses   Final diagnoses:  Peripheral edema  Cellulitis of right lower extremity    ED Discharge Orders        Ordered    doxycycline (VIBRAMYCIN) 100 MG capsule  2 times daily     10/16/17 0008    furosemide (LASIX) 20 MG tablet  Daily     10/16/17 0008    VAS US LOWER EXTREMITY VENOUS (DVT)     10/16/17 0015       Rheannon Cerney, Dahlia ClientHannah, PA-C 10/16/17 0027    Tegeler, Canary Brimhristopher J, MD 10/16/17 [phone]

## 2017-10-16 NOTE — Discharge Instructions (Signed)
1. Medications: Lasix, doxycycline, usual home medications 2. Treatment: rest, drink plenty of fluids, elevate legs 3. Follow Up: [facility] or for further evaluation and monitoring within the week; Please return to the ER immediately or within 48 hours for worsening signs of infection, worsening redness, fevers, chills or other concerns.

## 2018-04-08 IMAGING — DX DG CHEST 2V
2 series · 2 of 2 positions shown · non-contrast
Comparison: None.

CLINICAL DATA: 41-year-old male with bilateral lower extremity
edema.

EXAM:
CHEST  2 VIEW

[chest pa]
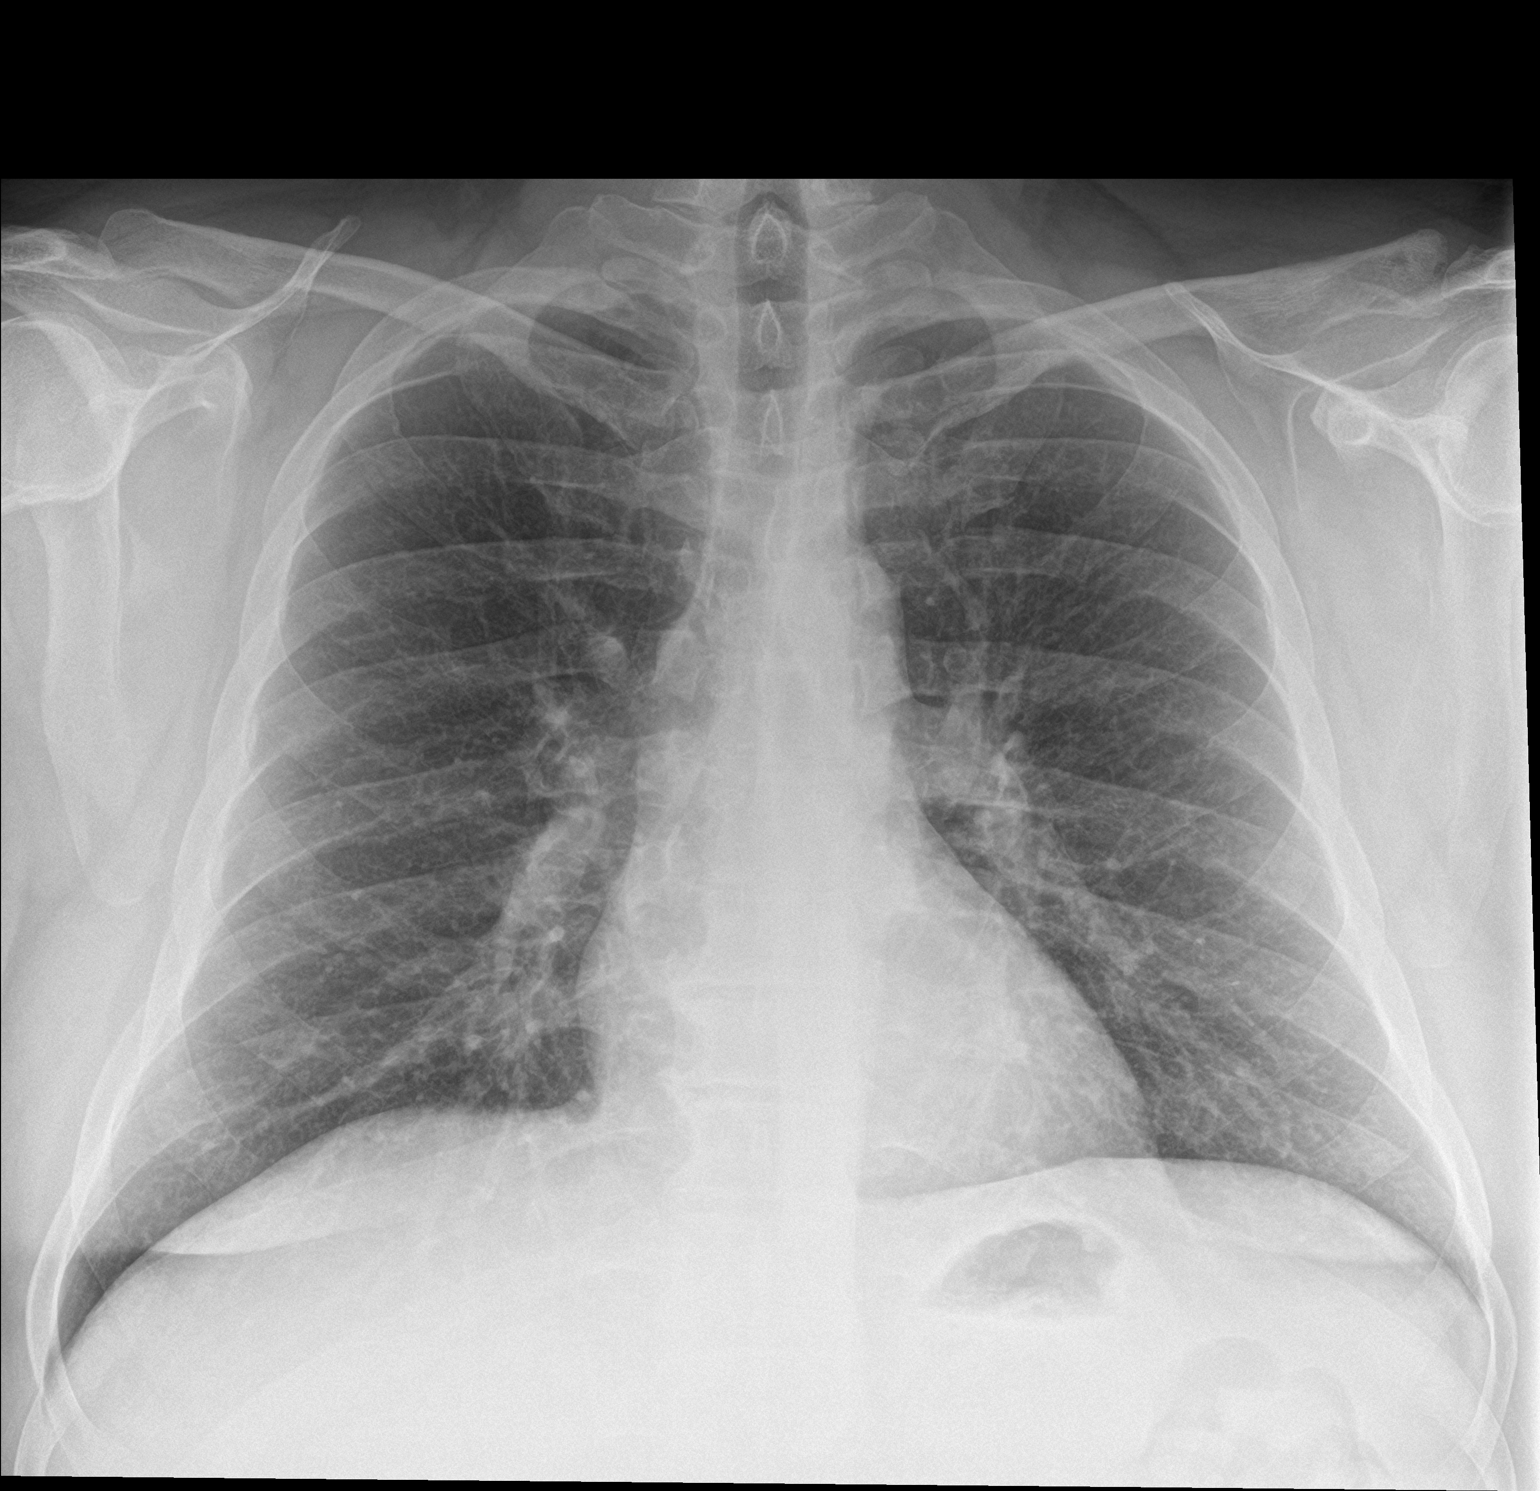

[chest lat]
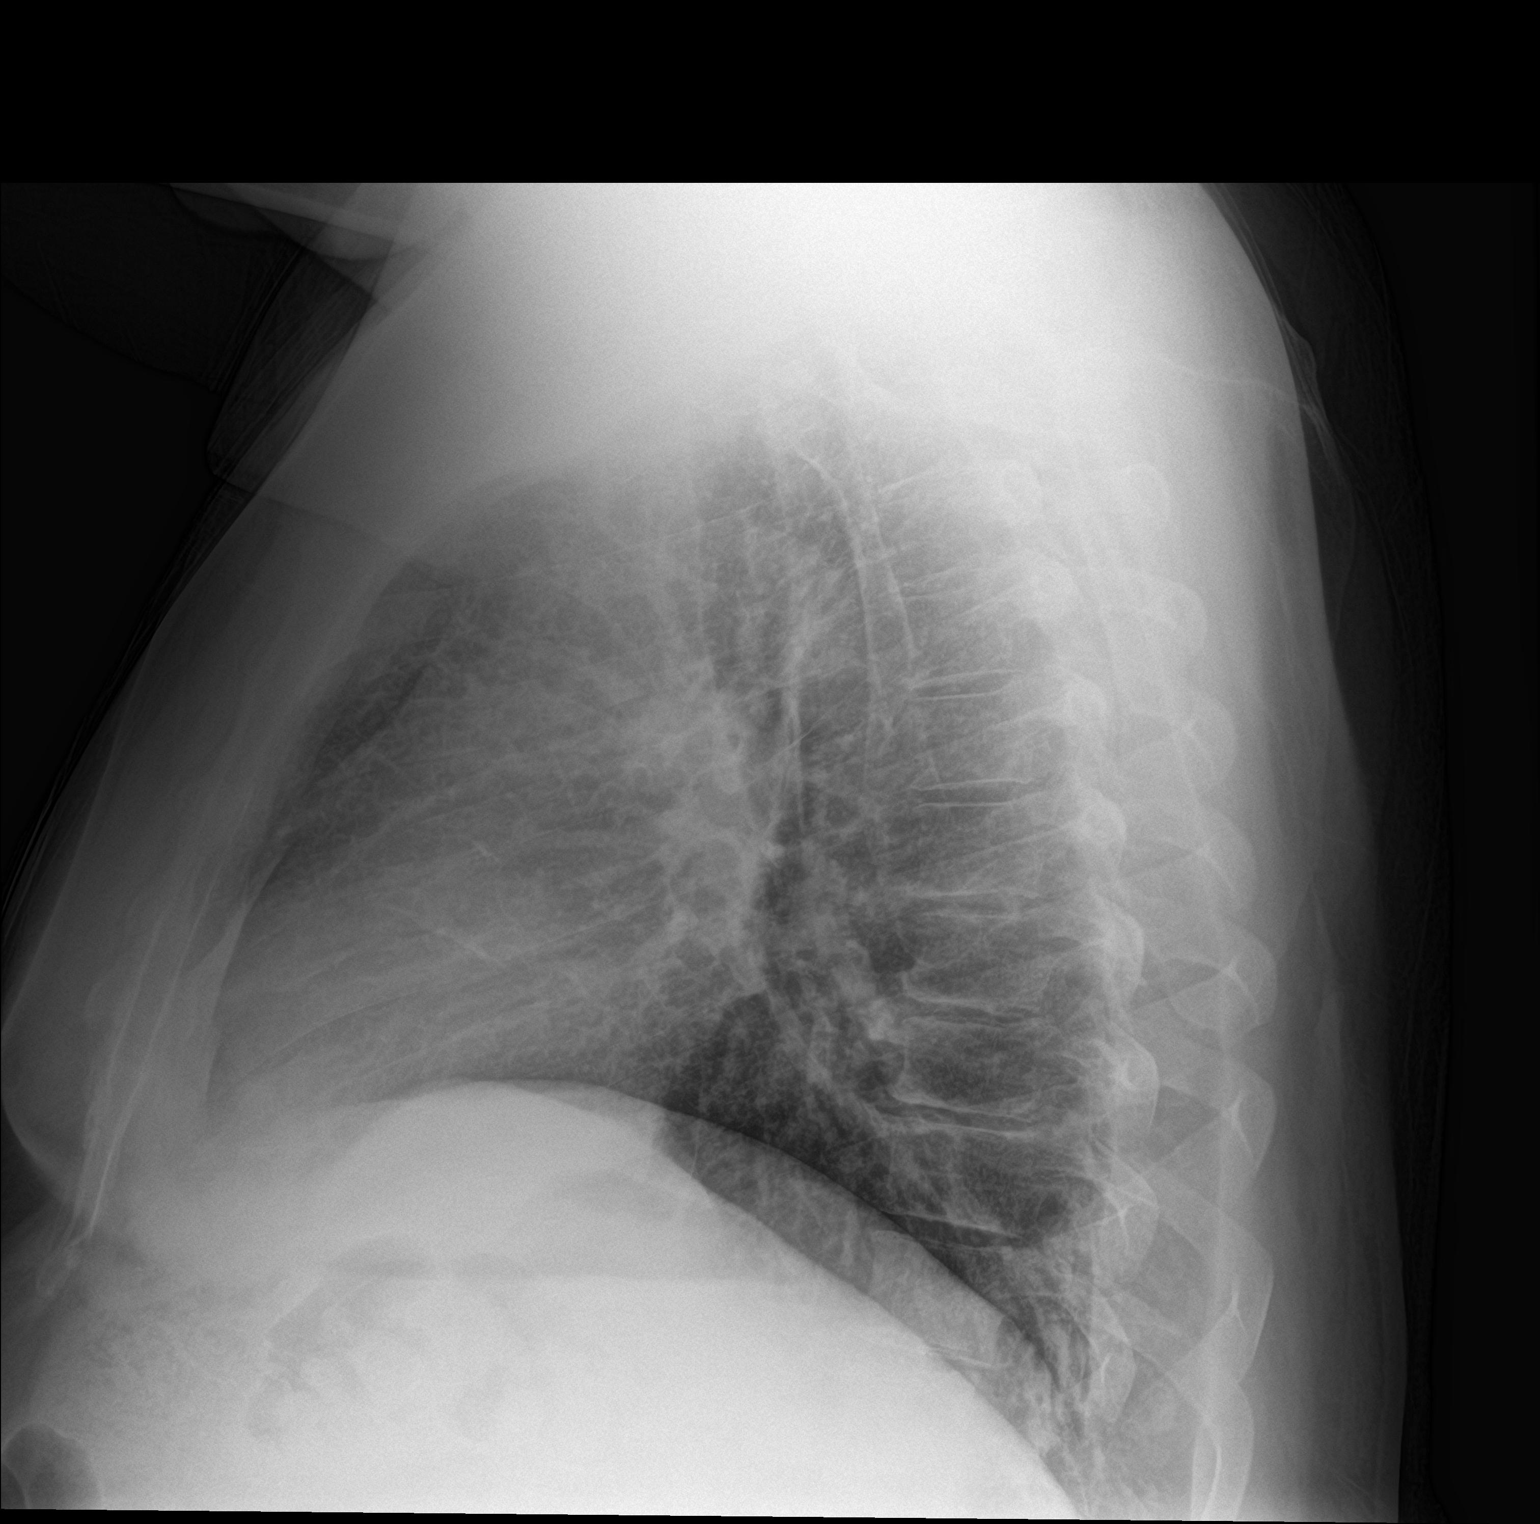

[2 of 2 positions shown; findings below may reference images not displayed]

FINDINGS: The lungs are clear. There is no pleural effusion pneumothorax. The
cardiac silhouette is within normal limits. No acute osseous
pathology.
IMPRESSION: No active cardiopulmonary disease.

## 2018-04-30 DEATH — deceased
